# Patient Record
Sex: Male | Born: 1960
Health system: Southern US, Community
[De-identification: ages and names within clinical notes are randomized; demographics above are authoritative.]

## PROBLEM LIST (undated history)

## (undated) DIAGNOSIS — D126 Benign neoplasm of colon, unspecified: Secondary | ICD-10-CM

## (undated) DIAGNOSIS — I10 Essential (primary) hypertension: Secondary | ICD-10-CM

## (undated) DIAGNOSIS — E785 Hyperlipidemia, unspecified: Secondary | ICD-10-CM

## (undated) DIAGNOSIS — C61 Malignant neoplasm of prostate: Secondary | ICD-10-CM

## (undated) DIAGNOSIS — N400 Enlarged prostate without lower urinary tract symptoms: Secondary | ICD-10-CM

## (undated) DIAGNOSIS — G4733 Obstructive sleep apnea (adult) (pediatric): Secondary | ICD-10-CM

## (undated) DIAGNOSIS — K219 Gastro-esophageal reflux disease without esophagitis: Secondary | ICD-10-CM

## (undated) HISTORY — PX: CYST EXCISION: SHX5701

## (undated) HISTORY — PX: VASECTOMY: SHX75

## (undated) HISTORY — PX: NO PAST SURGERIES: SHX2092

---

## 2012-01-21 DIAGNOSIS — D126 Benign neoplasm of colon, unspecified: Secondary | ICD-10-CM | POA: Insufficient documentation

## 2015-05-16 DIAGNOSIS — Z0000 Encounter for general adult medical examination without abnormal findings: Secondary | ICD-10-CM | POA: Diagnosis not present

## 2015-05-16 DIAGNOSIS — I10 Essential (primary) hypertension: Secondary | ICD-10-CM | POA: Diagnosis not present

## 2015-05-16 DIAGNOSIS — E785 Hyperlipidemia, unspecified: Secondary | ICD-10-CM | POA: Diagnosis not present

## 2015-06-05 DIAGNOSIS — I10 Essential (primary) hypertension: Secondary | ICD-10-CM | POA: Diagnosis not present

## 2015-06-05 DIAGNOSIS — N400 Enlarged prostate without lower urinary tract symptoms: Secondary | ICD-10-CM | POA: Diagnosis not present

## 2015-06-05 DIAGNOSIS — E785 Hyperlipidemia, unspecified: Secondary | ICD-10-CM | POA: Diagnosis not present

## 2015-11-21 DIAGNOSIS — N400 Enlarged prostate without lower urinary tract symptoms: Secondary | ICD-10-CM | POA: Diagnosis not present

## 2015-11-21 DIAGNOSIS — N401 Enlarged prostate with lower urinary tract symptoms: Secondary | ICD-10-CM | POA: Diagnosis not present

## 2015-11-21 DIAGNOSIS — I10 Essential (primary) hypertension: Secondary | ICD-10-CM | POA: Diagnosis not present

## 2015-11-21 DIAGNOSIS — R972 Elevated prostate specific antigen [PSA]: Secondary | ICD-10-CM | POA: Diagnosis not present

## 2015-11-21 DIAGNOSIS — E785 Hyperlipidemia, unspecified: Secondary | ICD-10-CM | POA: Diagnosis not present

## 2015-11-27 ENCOUNTER — Encounter: Payer: Self-pay | Admitting: Urology

## 2015-11-27 ENCOUNTER — Ambulatory Visit (INDEPENDENT_AMBULATORY_CARE_PROVIDER_SITE_OTHER): Payer: BLUE CROSS/BLUE SHIELD | Admitting: Urology

## 2015-11-27 VITALS — BP 159/83 | HR 48 | Ht 73.0 in | Wt 231.2 lb

## 2015-11-27 DIAGNOSIS — R0683 Snoring: Secondary | ICD-10-CM | POA: Insufficient documentation

## 2015-11-27 DIAGNOSIS — E785 Hyperlipidemia, unspecified: Secondary | ICD-10-CM | POA: Insufficient documentation

## 2015-11-27 DIAGNOSIS — I10 Essential (primary) hypertension: Secondary | ICD-10-CM | POA: Insufficient documentation

## 2015-11-27 DIAGNOSIS — R351 Nocturia: Secondary | ICD-10-CM

## 2015-11-27 DIAGNOSIS — R972 Elevated prostate specific antigen [PSA]: Secondary | ICD-10-CM | POA: Diagnosis not present

## 2015-11-27 DIAGNOSIS — N400 Enlarged prostate without lower urinary tract symptoms: Secondary | ICD-10-CM | POA: Insufficient documentation

## 2015-11-27 DIAGNOSIS — N401 Enlarged prostate with lower urinary tract symptoms: Secondary | ICD-10-CM | POA: Diagnosis not present

## 2015-11-27 NOTE — Progress Notes (Signed)
11/27/2015 3:19 PM   Garrett Watkins 12/16/1960 [id number]  Referring provider: Kirk Ruths, MD [facility] [place], [place] [address]  Chief Complaint  Patient presents with  . New Patient (Initial Visit)    elevated PSA     HPI: The patient is a 55 year old gentleman who presents today for evaluation of an elevated PSA. His PSA in November 2017 was 4.14. It was last checked in May 2017 when it was 3.52.  It was 1.35 in January 2014. He has nocturia 2 token be as high as 4 times and some nights. He feels that he empties his bladder. His sternal starting his stream. He gets occasional urinary urgency which is bothersome when his posterior meeting. He has no other urological complaints at this time.   PMH: No past medical history on file.  Surgical History: Past Surgical History:  Procedure Laterality Date  . NO PAST SURGERIES      Home Medications:    Medication List       Accurate as of 11/27/15  3:19 PM. Always use your most recent med list.          atorvastatin 20 MG tablet Commonly known as:  LIPITOR Take by mouth.   carvedilol 6.25 MG tablet Commonly known as:  COREG Take by mouth.   losartan-hydrochlorothiazide 100-12.5 MG tablet Commonly known as:  HYZAAR Take by mouth.   MULTI VITAMIN DAILY PO Take by mouth.       Allergies:  Allergies  Allergen Reactions  . Amlodipine Other (See Comments)    edema    Family History: Family History  Problem Relation Age of Onset  . Prostate cancer Neg Hx     Social History:  reports that he has never smoked. He has never used smokeless tobacco. He reports that he does not drink alcohol or use drugs.  ROS: UROLOGY Frequent Urination?: Yes Hard to postpone urination?: No Burning/pain with urination?: No Get up at night to urinate?: Yes Leakage of urine?: No Urine stream starts and stops?: Yes Trouble starting stream?: No Do you have to strain to urinate?:  No Blood in urine?: No Urinary tract infection?: No Sexually transmitted disease?: No Injury to kidneys or bladder?: No Painful intercourse?: No Weak stream?: Yes Erection problems?: No Penile pain?: No  Gastrointestinal Nausea?: No Vomiting?: No Indigestion/heartburn?: No Diarrhea?: No Constipation?: No  Constitutional Fever: No Night sweats?: No Weight loss?: No Fatigue?: No  Skin Skin rash/lesions?: No Itching?: No  Eyes Blurred vision?: No Double vision?: No  Ears/Nose/Throat Sore throat?: No Sinus problems?: No  Hematologic/Lymphatic Swollen glands?: No Easy bruising?: No  Cardiovascular Leg swelling?: No Chest pain?: No  Respiratory Cough?: No Shortness of breath?: No  Endocrine Excessive thirst?: No  Musculoskeletal Back pain?: No Joint pain?: No  Neurological Headaches?: No Dizziness?: No  Psychologic Depression?: No Anxiety?: No  Physical Exam: BP (!) 159/83   Pulse (!) 48   Ht 6\' 1"  (1.854 m)   Wt 231 lb 3.2 oz (104.9 kg)   BMI 30.50 kg/m   Constitutional:  Alert and oriented, No acute distress. HEENT: [place] AT, moist mucus membranes.  Trachea midline, no masses. Cardiovascular: No clubbing, cyanosis, or edema. Respiratory: Normal respiratory effort, no increased work of breathing. GI: Abdomen is soft, nontender, nondistended, no abdominal masses GU: No CVA tenderness. Normal phallus. Testicles descended equal bilaterally no masses. DRE: 2+ smooth benign. Skin: No rashes, bruises or suspicious lesions. Lymph: No cervical or inguinal  adenopathy. Neurologic: Grossly intact, no focal deficits, moving all 4 extremities. Psychiatric: Normal mood and affect.  Laboratory Data: No results found for: WBC, HGB, HCT, MCV, PLT  No results found for: CREATININE  No results found for: PSA  No results found for: TESTOSTERONE  No results found for: HGBA1C  Urinalysis No results found for: COLORURINE, APPEARANCEUR, LABSPEC, PHURINE,  GLUCOSEU, HGBUR, BILIRUBINUR, KETONESUR, PROTEINUR, UROBILINOGEN, NITRITE, LEUKOCYTESUR    Assessment & Plan:    1. Elevated PSA I had a long discussion with the patient regarding the role of PSA testing and prostate cancer screening. We also discussed the natural history of prostate cancer. We discussed that the next for some of elevated PSAs to confirm that this is a true elevation with a repeat PSA test. We also discussed with that we typically schedule prostate biopsy pending the results of this repeat PSA. He understands the risks, benefits, indications for prostate biopsy. He understands the risks including bleeding and infection. He understands any blood in his urine, stool, an semen. In his semen and may persist for up to 6 weeks. He also understands the risk of infection of about 1% would require inpatient hospitalization for IV antibiotics. All questions were answered. The patient has elected to proceed with prostate biopsy pending the results of his repeat PSA.  2. BPH The patient is unsure if he wants to start a medication for his symptomatic BPH. I think you to be a good candidate for Flomax at this time. He will think about this medication if he would like to start in the future.  Return for prostate biopsy.  Nickie Retort, MD  [facility] [address], [place], [place] [address] [phone]

## 2015-11-28 LAB — FPSA% REFLEX
% FREE PSA: 10 %
PSA, FREE: 0.41 ng/mL

## 2015-11-28 LAB — PSA TOTAL (REFLEX TO FREE): Prostate Specific Ag, Serum: 4.1 ng/mL — ABNORMAL HIGH (ref 0.0–4.0)

## 2015-12-03 ENCOUNTER — Telehealth: Payer: Self-pay

## 2015-12-03 NOTE — Telephone Encounter (Signed)
Please let patient knew PSA remains elevated, so we should proceed with prostate biopsy. Thanks   Pt notified, no questions or concerns.

## 2015-12-30 ENCOUNTER — Ambulatory Visit: Payer: BLUE CROSS/BLUE SHIELD | Admitting: Urology

## 2015-12-30 ENCOUNTER — Other Ambulatory Visit: Payer: Self-pay | Admitting: Urology

## 2015-12-30 DIAGNOSIS — R350 Frequency of micturition: Secondary | ICD-10-CM | POA: Diagnosis not present

## 2015-12-30 DIAGNOSIS — C61 Malignant neoplasm of prostate: Secondary | ICD-10-CM | POA: Diagnosis not present

## 2015-12-30 DIAGNOSIS — R972 Elevated prostate specific antigen [PSA]: Secondary | ICD-10-CM | POA: Insufficient documentation

## 2015-12-30 MED ORDER — GENTAMICIN SULFATE 40 MG/ML IJ SOLN
80.0000 mg | Freq: Once | INTRAMUSCULAR | Status: AC
  Administered 2015-12-30: 80 mg via INTRAMUSCULAR

## 2015-12-30 MED ORDER — LEVOFLOXACIN 500 MG PO TABS
500.0000 mg | ORAL_TABLET | Freq: Once | ORAL | Status: AC
  Administered 2015-12-30: 500 mg via ORAL

## 2015-12-30 MED ORDER — LIDOCAINE HCL 2 % EX GEL
1.0000 "application " | Freq: Once | CUTANEOUS | Status: AC
  Administered 2015-12-30: 1

## 2015-12-30 NOTE — Addendum Note (Signed)
Addended by: Wilson Singer on: 12/30/2015 09:16 AM   Modules accepted: Orders

## 2015-12-30 NOTE — Progress Notes (Signed)
Marrio Kwolek 1960-11-12 [id number]  Referring provider: Kirk Ruths, MD [facility] [place], [place] [address]  CC: Prostate Biopsy  HPI:  1 - Elevated / Rising PSA -  2014 - PSA 1.35 05/2015 - PSA 3.52;  11/2015 PSA 4.14 --> TRUS 77mL  2 - Lower Urinary Tract Symptoms / Nocturia - pt with modest bother from urgency / freqeuncy with nocturia x 4. Has elected observation.   Today "Garrett Watkins" is seen to proceed with prostate biopsy. He took ABX and enema as RX'd.     Surgical History: Past Surgical History:  Procedure Laterality Date  . NO PAST SURGERIES      Home Medications:    Medication List       Accurate as of 11/27/15  3:19 PM. Always use your most recent med list.          atorvastatin 20 MG tablet Commonly known as:  LIPITOR Take by mouth.   carvedilol 6.25 MG tablet Commonly known as:  COREG Take by mouth.   losartan-hydrochlorothiazide 100-12.5 MG tablet Commonly known as:  HYZAAR Take by mouth.   MULTI VITAMIN DAILY PO Take by mouth.       Allergies:  Allergies  Allergen Reactions  . Amlodipine Other (See Comments)    edema    Family History: Family History  Problem Relation Age of Onset  . Prostate cancer Neg Hx     Social History:  reports that he has never smoked. He has never used smokeless tobacco. He reports that he does not drink alcohol or use drugs.  ROS: UROLOGY Frequent Urination?: Yes Hard to postpone urination?: No Burning/pain with urination?: No Get up at night to urinate?: Yes Leakage of urine?: No Urine stream starts and stops?: Yes Trouble starting stream?: No Do you have to strain to urinate?: No Blood in urine?: No Urinary tract infection?: No Sexually transmitted disease?: No Injury to kidneys or bladder?: No Painful intercourse?: No Weak stream?: Yes Erection problems?: No Penile pain?: No  Gastrointestinal Nausea?: No Vomiting?: No Indigestion/heartburn?:  No Diarrhea?: No Constipation?: No  Constitutional Fever: No Night sweats?: No Weight loss?: No Fatigue?: No  Skin Skin rash/lesions?: No Itching?: No  Eyes Blurred vision?: No Double vision?: No  Ears/Nose/Throat Sore throat?: No Sinus problems?: No  Hematologic/Lymphatic Swollen glands?: No Easy bruising?: No  Cardiovascular Leg swelling?: No Chest pain?: No  Respiratory Cough?: No Shortness of breath?: No  Endocrine Excessive thirst?: No  Musculoskeletal Back pain?: No Joint pain?: No  Neurological Headaches?: No Dizziness?: No  Psychologic Depression?: No Anxiety?: No  Physical Exam: BP (!) 159/83   Pulse (!) 48   Ht 6\' 1"  (1.854 m)   Wt 231 lb 3.2 oz (104.9 kg)   BMI 30.50 kg/m   Constitutional:  Alert and oriented, No acute distress. HEENT: [place] AT, moist mucus membranes.  Trachea midline, no masses. Cardiovascular: No clubbing, cyanosis, or edema. Respiratory: Normal respiratory effort, no increased work of breathing. GI: Abdomen is soft, nontender, nondistended, no abdominal masses GU: No CVA tenderness. Normal phallus. Testicles descended equal bilaterally no masses. DRE: 2+ smooth benign. Skin: No rashes, bruises or suspicious lesions. Lymph: No cervical or inguinal adenopathy. Neurologic: Grossly intact, no focal deficits, moving all 4 extremities. Psychiatric: Normal mood and affect.  Laboratory Data: No results found for: WBC, HGB, HCT, MCV, PLT  No results found for: CREATININE  No results found for: PSA  No results found for: TESTOSTERONE  No results found for: HGBA1C  Urinalysis No results found for: COLORURINE, APPEARANCEUR, LABSPEC, [place], GLUCOSEU, HGBUR, BILIRUBINUR, KETONESUR, PROTEINUR, UROBILINOGEN, NITRITE, LEUKOCYTESUR   Prostate Biopsy Procedure   Informed consent was obtained after discussing risks/benefits of the procedure.  A time out was performed to ensure correct patient identity.  Pre-Procedure: -  Last PSA Level: No results found for: PSA - Gentamicin given prophylactically - Levaquin 500 mg administered PO -Transrectal Ultrasound performed revealing a 43.6 gm prostate -No significant hypoechoic or median lobe noted  Procedure: - Prostate block performed using 10 cc 1% lidocaine and biopsies taken from sextant areas, a total of 12 under ultrasound guidance.  Post-Procedure: - Patient tolerated the procedure well - He was counseled to seek immediate medical attention if experiences any severe pain, significant bleeding, or fevers - Return in one week to discuss biopsy results   Assessment & Plan:    1 - Elevated / Rising PSA - s/p biopsy today as per above. Warned to contact MD for new fever 101 or more or inabiliity to void.   2 - Lower Urinary Tract Symptoms / Nocturia - consider alpha blocker should bother progress.  3 - RTC as scheduled for results review.   [facility] [address], [place], [place] [address] [phone]

## 2016-01-04 LAB — PATHOLOGY REPORT

## 2016-01-09 ENCOUNTER — Other Ambulatory Visit: Payer: Self-pay | Admitting: Urology

## 2016-01-16 ENCOUNTER — Encounter: Payer: Self-pay | Admitting: Urology

## 2016-01-16 ENCOUNTER — Ambulatory Visit (INDEPENDENT_AMBULATORY_CARE_PROVIDER_SITE_OTHER): Payer: BLUE CROSS/BLUE SHIELD | Admitting: Urology

## 2016-01-16 VITALS — BP 200/124 | HR 67 | Ht 73.0 in | Wt 235.6 lb

## 2016-01-16 DIAGNOSIS — N400 Enlarged prostate without lower urinary tract symptoms: Secondary | ICD-10-CM

## 2016-01-16 DIAGNOSIS — C61 Malignant neoplasm of prostate: Secondary | ICD-10-CM | POA: Diagnosis not present

## 2016-01-16 DIAGNOSIS — I10 Essential (primary) hypertension: Secondary | ICD-10-CM | POA: Diagnosis not present

## 2016-01-16 MED ORDER — TAMSULOSIN HCL 0.4 MG PO CAPS: 0.4000 mg | ORAL_CAPSULE | Freq: Every day | ORAL | 11 refills | Status: DC

## 2016-01-16 NOTE — Progress Notes (Addendum)
01/16/2016 12:29 PM   Garrett Watkins March 14, 1960 [id number]  Referring provider: Kirk Ruths, MD [facility] [place], [place] [address]  Chief Complaint  Patient presents with  . Results    HPI: 1 - Elevated / Rising PSA/Prostate Cancer 2014 - PSA 1.35 05/2015 - PSA 3.52;  11/2015 PSA 4.14 --> TRUS 34mL  Biopsy showed 3 of 12 cores positive. 21% of one core of Gleason 3+4=7. 2 cores (2%, 52%) gleason 3+3=6.  2 - Lower Urinary Tract Symptoms / Nocturia - pt with modest bother from urgency / freqeuncy with nocturia x 4. Has elected observation.      PMH: No past medical history on file.  Surgical History: Past Surgical History:  Procedure Laterality Date  . NO PAST SURGERIES      Home Medications:  Allergies as of 01/16/2016      Reactions   Amlodipine Other (See Comments)   edema      Medication List       Accurate as of 01/16/16 12:29 PM. Always use your most recent med list.          atorvastatin 20 MG tablet Commonly known as:  LIPITOR Take by mouth.   carvedilol 6.25 MG tablet Commonly known as:  COREG Take by mouth.   losartan 100 MG tablet Commonly known as:  COZAAR TAKE 1 TABLET (100 MG TOTAL) BY MOUTH ONCE DAILY.   losartan-hydrochlorothiazide 100-12.5 MG tablet Commonly known as:  HYZAAR Take by mouth.   MULTI VITAMIN DAILY PO Take by mouth.       Allergies:  Allergies  Allergen Reactions  . Amlodipine Other (See Comments)    edema    Family History: Family History  Problem Relation Age of Onset  . Prostate cancer Neg Hx     Social History:  reports that he has never smoked. He has never used smokeless tobacco. He reports that he does not drink alcohol or use drugs.  ROS: UROLOGY Frequent Urination?: Yes Hard to postpone urination?: No Burning/pain with urination?: No Get up at night to urinate?: Yes Leakage of urine?: No Urine stream starts and stops?: No Trouble starting stream?:  No Do you have to strain to urinate?: Yes Blood in urine?: No Urinary tract infection?: No Sexually transmitted disease?: No Injury to kidneys or bladder?: No Painful intercourse?: No Weak stream?: Yes Erection problems?: No Penile pain?: No  Gastrointestinal Nausea?: No Vomiting?: No Indigestion/heartburn?: No Diarrhea?: No Constipation?: No  Constitutional Fever: No Night sweats?: No Weight loss?: No Fatigue?: No  Skin Skin rash/lesions?: No Itching?: No  Eyes Blurred vision?: No Double vision?: No  Ears/Nose/Throat Sore throat?: Yes Sinus problems?: Yes  Hematologic/Lymphatic Swollen glands?: No Easy bruising?: No  Cardiovascular Leg swelling?: No Chest pain?: No  Respiratory Cough?: Yes Shortness of breath?: Yes  Endocrine Excessive thirst?: No  Musculoskeletal Back pain?: No Joint pain?: No  Neurological Headaches?: No Dizziness?: No  Psychologic Depression?: No Anxiety?: No  Physical Exam: BP (!) 200/124   Pulse 67   Ht 6\' 1"  (1.854 m)   Wt 235 lb 9.6 oz (106.9 kg)   BMI 31.08 kg/m   Constitutional:  Alert and oriented, No acute distress. HEENT: [place] AT, moist mucus membranes.  Trachea midline, no masses. Cardiovascular: No clubbing, cyanosis, or edema. Respiratory: Normal respiratory effort, no increased work of breathing. GI: Abdomen is soft, nontender, nondistended, no abdominal masses GU: No CVA tenderness.  Skin: No rashes, bruises or suspicious lesions.  Lymph: No cervical or inguinal adenopathy. Neurologic: Grossly intact, no focal deficits, moving all 4 extremities. Psychiatric: Normal mood and affect.  Laboratory Data: No results found for: WBC, HGB, HCT, MCV, PLT  No results found for: CREATININE  No results found for: PSA  No results found for: TESTOSTERONE  No results found for: HGBA1C  Urinalysis No results found for: COLORURINE, APPEARANCEUR, LABSPEC, PHURINE, GLUCOSEU, HGBUR, BILIRUBINUR, KETONESUR,  PROTEINUR, UROBILINOGEN, NITRITE, LEUKOCYTESUR   Assessment & Plan:    1. Prostate Cancer I had a long discussion with the patient regarding his new diagnosis of intermediate risk prostate cancer. We discussed the natural history of prostate cancer as well as the possible treatment alternatives. We discussed all treatment options that are available for prostate cancer which include watchful waiting, active surveillance, robotic prostatectomy, radiation therapy, and cryotherapy. I did discuss with him due to his intermediate risk disease that he would be best served by either radiation therapy or robotic prostatectomy. We discussed the risks and benefits of both treatment of options. He does have great erections per him and his wife at this time and is able to complete intercourse. If he went with the prostatectomy, I do feel he would benefit from nursing care. I think she will be best served by seeing my partner who is more proficient at the nerve sparing procedure. At this time, he would like to discuss this further with his family and see radiation oncology in consultation. We will have him see radiation oncology and follow-up with [place] in approximately one month to further discuss his treatment decisions. He was also given the 100 questions of prostate cancer booklet.  2. BPH Patient interested in trying Flomax for his urinary symptoms especially nocturia. We will start him on Flomax or 0.4 mg daily. He is warm the risk of orthostatic hypotension and retrograde ejaculation.  Return in about 4 weeks (around 02/13/2016).  Nickie Retort, MD  [facility] [address], [place], [place] [address] [phone]

## 2016-01-16 NOTE — Addendum Note (Signed)
Addended by: Kerry Hough on: 01/16/2016 12:42 PM   Modules accepted: Orders

## 2016-01-24 ENCOUNTER — Encounter: Payer: Self-pay | Admitting: Emergency Medicine

## 2016-01-24 ENCOUNTER — Emergency Department: Payer: BLUE CROSS/BLUE SHIELD

## 2016-01-24 ENCOUNTER — Observation Stay
Admission: EM | Admit: 2016-01-24 | Discharge: 2016-01-25 | Disposition: A | Discharge status: Home / Self Care | Payer: BLUE CROSS/BLUE SHIELD | Attending: Internal Medicine | Admitting: Internal Medicine

## 2016-01-24 ENCOUNTER — Encounter
Admission: EM | Disposition: A | Discharge status: Home / Self Care | Payer: Self-pay | Source: Home / Self Care | Attending: Emergency Medicine

## 2016-01-24 DIAGNOSIS — N400 Enlarged prostate without lower urinary tract symptoms: Secondary | ICD-10-CM | POA: Diagnosis not present

## 2016-01-24 DIAGNOSIS — I200 Unstable angina: Secondary | ICD-10-CM | POA: Diagnosis not present

## 2016-01-24 DIAGNOSIS — R0789 Other chest pain: Secondary | ICD-10-CM | POA: Diagnosis not present

## 2016-01-24 DIAGNOSIS — I10 Essential (primary) hypertension: Secondary | ICD-10-CM | POA: Insufficient documentation

## 2016-01-24 DIAGNOSIS — Z79899 Other long term (current) drug therapy: Secondary | ICD-10-CM | POA: Diagnosis not present

## 2016-01-24 DIAGNOSIS — I214 Non-ST elevation (NSTEMI) myocardial infarction: Principal | ICD-10-CM | POA: Insufficient documentation

## 2016-01-24 DIAGNOSIS — E782 Mixed hyperlipidemia: Secondary | ICD-10-CM | POA: Diagnosis not present

## 2016-01-24 DIAGNOSIS — C61 Malignant neoplasm of prostate: Secondary | ICD-10-CM | POA: Insufficient documentation

## 2016-01-24 DIAGNOSIS — I160 Hypertensive urgency: Secondary | ICD-10-CM | POA: Diagnosis not present

## 2016-01-24 DIAGNOSIS — R079 Chest pain, unspecified: Secondary | ICD-10-CM | POA: Diagnosis present

## 2016-01-24 DIAGNOSIS — I499 Cardiac arrhythmia, unspecified: Secondary | ICD-10-CM | POA: Diagnosis present

## 2016-01-24 HISTORY — DX: Obstructive sleep apnea (adult) (pediatric): G47.33

## 2016-01-24 HISTORY — DX: Malignant neoplasm of prostate: C61

## 2016-01-24 HISTORY — DX: Essential (primary) hypertension: I10

## 2016-01-24 HISTORY — PX: CARDIAC CATHETERIZATION: SHX172

## 2016-01-24 HISTORY — DX: Benign neoplasm of colon, unspecified: D12.6

## 2016-01-24 HISTORY — DX: Benign prostatic hyperplasia without lower urinary tract symptoms: N40.0

## 2016-01-24 HISTORY — DX: Hyperlipidemia, unspecified: E78.5

## 2016-01-24 LAB — BASIC METABOLIC PANEL
Anion gap: 7 (ref 5–15)
BUN: 14 mg/dL (ref 6–20)
CALCIUM: 9.3 mg/dL (ref 8.9–10.3)
CO2: 28 mmol/L (ref 22–32)
Chloride: 102 mmol/L (ref 101–111)
Creatinine, Ser: 1.04 mg/dL (ref 0.61–1.24)
GFR calc Af Amer: >60 mL/min (ref >60)
GLUCOSE: 94 mg/dL (ref 65–99)
Potassium: 4.7 mmol/L (ref 3.5–5.1)
SODIUM: 137 mmol/L (ref 135–145)

## 2016-01-24 LAB — TSH: TSH: 1.502 u[IU]/mL (ref 0.350–4.500)

## 2016-01-24 LAB — CBC
HEMATOCRIT: 39.9 % — AB (ref 40.0–52.0)
Hemoglobin: 13.6 g/dL (ref 13.0–18.0)
MCH: 29.9 pg (ref 26.0–34.0)
MCHC: 34 g/dL (ref 32.0–36.0)
MCV: 87.9 fL (ref 80.0–100.0)
PLATELETS: 176 10*3/uL (ref 150–440)
RBC: 4.54 MIL/uL (ref 4.40–5.90)
RDW: 13.7 % (ref 11.5–14.5)
WBC: 5.4 10*3/uL (ref 3.8–10.6)

## 2016-01-24 LAB — TROPONIN I

## 2016-01-24 SURGERY — LEFT HEART CATH AND CORONARY ANGIOGRAPHY
Anesthesia: Moderate Sedation | Laterality: Right

## 2016-01-24 MED ORDER — ONDANSETRON HCL 4 MG/2ML IJ SOLN: 4.0000 mg | Freq: Four times a day (QID) | INTRAMUSCULAR | Status: DC | PRN

## 2016-01-24 MED ORDER — MIDAZOLAM HCL 2 MG/2ML IJ SOLN
INTRAMUSCULAR | Status: AC
  Filled 2016-01-24: qty 2

## 2016-01-24 MED ORDER — ASPIRIN 81 MG PO CHEW: 81.0000 mg | CHEWABLE_TABLET | ORAL | Status: DC

## 2016-01-24 MED ORDER — HEPARIN (PORCINE) IN NACL 2-0.9 UNIT/ML-% IJ SOLN
INTRAMUSCULAR | Status: AC
  Filled 2016-01-24: qty 500

## 2016-01-24 MED ORDER — FENTANYL CITRATE (PF) 100 MCG/2ML IJ SOLN
INTRAMUSCULAR | Status: AC
  Filled 2016-01-24: qty 2

## 2016-01-24 MED ORDER — ATENOLOL 25 MG PO TABS: 50.0000 mg | ORAL_TABLET | Freq: Every day | ORAL | Status: DC

## 2016-01-24 MED ORDER — SODIUM CHLORIDE 0.9% FLUSH
3.0000 mL | Freq: Two times a day (BID) | INTRAVENOUS | Status: DC
  Administered 2016-01-24: 3 mL via INTRAVENOUS

## 2016-01-24 MED ORDER — ONDANSETRON HCL 4 MG PO TABS: 4.0000 mg | ORAL_TABLET | Freq: Four times a day (QID) | ORAL | Status: DC | PRN

## 2016-01-24 MED ORDER — HYDROCHLOROTHIAZIDE 25 MG PO TABS
25.0000 mg | ORAL_TABLET | Freq: Every day | ORAL | Status: DC
  Filled 2016-01-24: qty 1

## 2016-01-24 MED ORDER — CARVEDILOL 25 MG PO TABS
25.0000 mg | ORAL_TABLET | Freq: Two times a day (BID) | ORAL | Status: DC
  Filled 2016-01-24 (×2): qty 1

## 2016-01-24 MED ORDER — ENOXAPARIN SODIUM 40 MG/0.4ML ~~LOC~~ SOLN
40.0000 mg | SUBCUTANEOUS | Status: DC
  Filled 2016-01-24: qty 0.4

## 2016-01-24 MED ORDER — SODIUM CHLORIDE 0.9 % IV SOLN: 250.0000 mL | INTRAVENOUS | Status: DC | PRN

## 2016-01-24 MED ORDER — FENTANYL CITRATE (PF) 100 MCG/2ML IJ SOLN
INTRAMUSCULAR | Status: DC | PRN
  Administered 2016-01-24: 25 ug via INTRAVENOUS

## 2016-01-24 MED ORDER — CLONIDINE HCL 0.1 MG PO TABS
0.2000 mg | ORAL_TABLET | Freq: Two times a day (BID) | ORAL | Status: DC
  Administered 2016-01-24 – 2016-01-25 (×3): 0.2 mg via ORAL
  Filled 2016-01-24 (×3): qty 2

## 2016-01-24 MED ORDER — ATORVASTATIN CALCIUM 20 MG PO TABS
40.0000 mg | ORAL_TABLET | Freq: Every day | ORAL | Status: DC
  Administered 2016-01-24: 40 mg via ORAL
  Filled 2016-01-24: qty 2

## 2016-01-24 MED ORDER — SODIUM CHLORIDE 0.9 % WEIGHT BASED INFUSION
1.0000 mL/kg/h | INTRAVENOUS | Status: DC
Start: 2016-01-24 — End: 2016-01-25
  Administered 2016-01-24: 1 mL/kg/h via INTRAVENOUS

## 2016-01-24 MED ORDER — LOSARTAN POTASSIUM-HCTZ 100-25 MG PO TABS: 1.0000 | ORAL_TABLET | Freq: Every day | ORAL | Status: DC

## 2016-01-24 MED ORDER — SODIUM CHLORIDE 0.9% FLUSH: 3.0000 mL | INTRAVENOUS | Status: DC | PRN

## 2016-01-24 MED ORDER — HEPARIN (PORCINE) IN NACL 2-0.9 UNIT/ML-% IJ SOLN
INTRAMUSCULAR | Status: DC | PRN
  Administered 2016-01-24: 500 mL via INTRA_ARTERIAL

## 2016-01-24 MED ORDER — LOSARTAN POTASSIUM 50 MG PO TABS
100.0000 mg | ORAL_TABLET | Freq: Every day | ORAL | Status: DC
  Administered 2016-01-25: 100 mg via ORAL
  Filled 2016-01-24 (×2): qty 2

## 2016-01-24 MED ORDER — ACETAMINOPHEN 325 MG PO TABS: 650.0000 mg | ORAL_TABLET | ORAL | Status: DC | PRN

## 2016-01-24 MED ORDER — ASPIRIN EC 325 MG PO TBEC
325.0000 mg | DELAYED_RELEASE_TABLET | Freq: Every day | ORAL | Status: DC
  Administered 2016-01-24 – 2016-01-25 (×2): 325 mg via ORAL
  Filled 2016-01-24 (×2): qty 1

## 2016-01-24 MED ORDER — SODIUM CHLORIDE 0.9 % WEIGHT BASED INFUSION
3.0000 mL/kg/h | INTRAVENOUS | Status: DC
  Administered 2016-01-24: 3 mL/kg/h via INTRAVENOUS

## 2016-01-24 MED ORDER — MIDAZOLAM HCL 2 MG/2ML IJ SOLN
INTRAMUSCULAR | Status: DC | PRN
Start: 2016-01-24 — End: 2016-01-24
  Administered 2016-01-24: 1 mg via INTRAVENOUS

## 2016-01-24 MED ORDER — SODIUM CHLORIDE 0.9 % WEIGHT BASED INFUSION: 1.0000 mL/kg/h | INTRAVENOUS | Status: DC

## 2016-01-24 MED ORDER — HYDRALAZINE HCL 20 MG/ML IJ SOLN
10.0000 mg | Freq: Four times a day (QID) | INTRAMUSCULAR | Status: DC | PRN
  Administered 2016-01-24: 10 mg via INTRAVENOUS
  Filled 2016-01-24: qty 1

## 2016-01-24 MED ORDER — CARVEDILOL 6.25 MG PO TABS: 6.2500 mg | ORAL_TABLET | Freq: Two times a day (BID) | ORAL | Status: DC

## 2016-01-24 MED ORDER — ACETAMINOPHEN 325 MG PO TABS: 650.0000 mg | ORAL_TABLET | Freq: Four times a day (QID) | ORAL | Status: DC | PRN

## 2016-01-24 MED ORDER — SODIUM CHLORIDE 0.9% FLUSH: 3.0000 mL | Freq: Two times a day (BID) | INTRAVENOUS | Status: DC

## 2016-01-24 MED ORDER — HYDROCODONE-ACETAMINOPHEN 5-325 MG PO TABS: 1.0000 | ORAL_TABLET | ORAL | Status: DC | PRN

## 2016-01-24 MED ORDER — TAMSULOSIN HCL 0.4 MG PO CAPS
0.4000 mg | ORAL_CAPSULE | Freq: Every day | ORAL | Status: DC
  Administered 2016-01-24: 0.4 mg via ORAL
  Filled 2016-01-24 (×2): qty 1

## 2016-01-24 MED ORDER — IOPAMIDOL (ISOVUE-300) INJECTION 61%
INTRAVENOUS | Status: DC | PRN
  Administered 2016-01-24: 110 mL via INTRA_ARTERIAL

## 2016-01-24 MED ORDER — ACETAMINOPHEN 650 MG RE SUPP: 650.0000 mg | Freq: Four times a day (QID) | RECTAL | Status: DC | PRN

## 2016-01-24 SURGICAL SUPPLY — 9 items
CATH 5FR JL4 DIAGNOSTIC (CATHETERS) ×3 IMPLANT
CATH 5FR JR4 DIAGNOSTIC (CATHETERS) ×3 IMPLANT
CATH 5FR PIGTAIL DIAGNOSTIC (CATHETERS) ×3 IMPLANT
DEVICE CLOSURE MYNXGRIP 5F (Vascular Products) ×3 IMPLANT
KIT MANI 3VAL PERCEP (MISCELLANEOUS) ×3 IMPLANT
NEEDLE PERC 18GX7CM (NEEDLE) ×3 IMPLANT
PACK CARDIAC CATH (CUSTOM PROCEDURE TRAY) ×3 IMPLANT
SHEATH PINNACLE 5F 10CM (SHEATH) ×3 IMPLANT
WIRE EMERALD 3MM-J .035X150CM (WIRE) ×3 IMPLANT

## 2016-01-24 NOTE — H&P (Signed)
[place] [facility] [place] NAME: Garrett Watkins    MR#:  [id number]  DATE OF BIRTH:  [birth year]  DATE OF ADMISSION:  01/24/2016  PRIMARY CARE PHYSICIAN: Kirk Ruths., MD   REQUESTING/REFERRING PHYSICIAN:  Conni Slipper MD  CHIEF COMPLAINT:   Chief Complaint  Patient presents with  . Irregular Heart Beat    HISTORY OF PRESENT ILLNESS: Garrett Watkins  is a 56 y.o. male with a known history of BPH, colon adenoma, Dyslipidemia, essential hypertension, obstructive sleep apnea and prostate cancer presents to the emergency room complaining of chest pressure. With exertion and rest. He reports that this is been going on for the past few days. He has had issues with his blood pressure being poorly controlled over the past few months. He states that his been tried on various medications without much help. He was seen by Dr. Ouida Sills recently and was started on a new medication however blood pressure continued to be elevated. He also has been feeling of left arm heaviness recently. He denies any radiation of the pain to his jaw. PAST MEDICAL HISTORY:   Past Medical History:  Diagnosis Date  . BPH (benign prostatic hyperplasia)   . Colon adenoma   . Dyslipidemia   . Hypertension   . OSA (obstructive sleep apnea)   . Prostate cancer ([place])     PAST SURGICAL HISTORY: Past Surgical History:  Procedure Laterality Date  . CYST EXCISION    . NO PAST SURGERIES    . VASECTOMY      SOCIAL HISTORY:  Social History  Substance Use Topics  . Smoking status: Never Smoker  . Smokeless tobacco: Never Used  . Alcohol use No    FAMILY HISTORY:  Family History  Problem Relation Age of Onset  . CAD Mother   . Prostate cancer Neg Hx     DRUG ALLERGIES:  Allergies  Allergen Reactions  . Amlodipine Other (See Comments)    edema    REVIEW OF SYSTEMS:   CONSTITUTIONAL: No fever, fatigue or weakness.  EYES: No blurred or double vision.  EARS, NOSE, AND  THROAT: No tinnitus or ear pain.  RESPIRATORY: No cough,  shortness of breath, wheezing or hemoptysis.  CARDIOVASCULAR: Positive chest pressure, orthopnea, edema.  GASTROINTESTINAL: No nausea, vomiting, diarrhea or abdominal pain.  GENITOURINARY: No dysuria, hematuria.  ENDOCRINE: No polyuria, nocturia,  HEMATOLOGY: No anemia, easy bruising or bleeding SKIN: No rash or lesion. MUSCULOSKELETAL: No joint pain or arthritis.   NEUROLOGIC: No tingling, numbness, weakness.  PSYCHIATRY: No anxiety or depression.   MEDICATIONS AT HOME:  Prior to Admission medications   Medication Sig Start Date End Date Taking? Authorizing Provider  atorvastatin (LIPITOR) 20 MG tablet Take by mouth. 05/16/15  Yes Historical Provider, MD  carvedilol (COREG) 6.25 MG tablet Take by mouth 2 (two) times daily with a meal.  11/21/15 11/20/16 Yes Historical Provider, MD  hydrALAZINE (APRESOLINE) 25 MG tablet Take 1 tablet by mouth 2 (two) times daily. 01/16/16  Yes Historical Provider, MD  losartan-hydrochlorothiazide (HYZAAR) 100-25 MG tablet Take 1 tablet by mouth daily. 01/16/16  Yes Historical Provider, MD  Multiple Vitamin (MULTI VITAMIN DAILY PO) Take by mouth. 03/01/08  Yes Historical Provider, MD  tamsulosin (FLOMAX) 0.4 MG CAPS capsule Take 1 capsule (0.4 mg total) by mouth daily. 01/16/16  Yes Nickie Retort, MD      PHYSICAL EXAMINATION:   VITAL SIGNS: Blood pressure (!) 190/115, pulse 74, temperature 98.2 F (36.8 C),  temperature source Oral, resp. rate 16, height 6\' 1"  (1.854 m), weight 225 lb (102.1 kg), SpO2 99 %.  GENERAL:  56 y.o.-year-old patient lying in the bed with no acute distress.  EYES: Pupils equal, round, reactive to light and accommodation. No scleral icterus. Extraocular muscles intact.  HEENT: Head atraumatic, normocephalic. Oropharynx and nasopharynx clear.  NECK:  Supple, no jugular venous distention. No thyroid enlargement, no tenderness.  LUNGS: Normal breath sounds bilaterally, no  wheezing, rales,rhonchi or crepitation. No use of accessory muscles of respiration.  CARDIOVASCULAR: S1, S2 normal. No murmurs, rubs, or gallops.  ABDOMEN: Soft, nontender, nondistended. Bowel sounds present. No organomegaly or mass.  EXTREMITIES: No pedal edema, cyanosis, or clubbing.  NEUROLOGIC: Cranial nerves II through XII are intact. Muscle strength 5/5 in all extremities. Sensation intact. Gait not checked.  PSYCHIATRIC: The patient is alert and oriented x 3.  SKIN: No obvious rash, lesion, or ulcer.   LABORATORY PANEL:   CBC  Recent Labs Lab 01/24/16 1215  WBC 5.4  HGB 13.6  HCT 39.9*  PLT 176  MCV 87.9  MCH 29.9  MCHC 34.0  RDW 13.7   ------------------------------------------------------------------------------------------------------------------  Chemistries   Recent Labs Lab 01/24/16 1215  NA 137  K 4.7  CL 102  CO2 28  GLUCOSE 94  BUN 14  CREATININE 1.04  CALCIUM 9.3   ------------------------------------------------------------------------------------------------------------------ estimated creatinine clearance is 100.8 mL/min (by C-G formula based on SCr of 1.04 mg/dL). ------------------------------------------------------------------------------------------------------------------ No results for input(s): TSH, T4TOTAL, T3FREE, THYROIDAB in the last 72 hours.  Invalid input(s): FREET3   Coagulation profile No results for input(s): INR, PROTIME in the last 168 hours. ------------------------------------------------------------------------------------------------------------------- No results for input(s): DDIMER in the last 72 hours. -------------------------------------------------------------------------------------------------------------------  Cardiac Enzymes  Recent Labs Lab 01/24/16 1215  TROPONINI <0.03   ------------------------------------------------------------------------------------------------------------------ Invalid  input(s): POCBNP  ---------------------------------------------------------------------------------------------------------------  Urinalysis No results found for: COLORURINE, APPEARANCEUR, LABSPEC, PHURINE, GLUCOSEU, HGBUR, BILIRUBINUR, KETONESUR, PROTEINUR, UROBILINOGEN, NITRITE, LEUKOCYTESUR   RADIOLOGY: Dg Chest Portable 1 View  Result Date: 01/24/2016 CLINICAL DATA:  Left-sided chest pressure, left arm pain. EXAM: PORTABLE CHEST 1 VIEW COMPARISON:  None. FINDINGS: Heart and mediastinal contours are within normal limits. No focal opacities or effusions. No acute bony abnormality. IMPRESSION: No active disease. Electronically Signed   By: Rolm Baptise M.D.   On: 01/24/2016 12:45    EKG: Orders placed or performed during the hospital encounter of 01/24/16  . ED EKG within 10 minutes  . ED EKG within 10 minutes  . EKG 12-Lead  . EKG 12-Lead    IMPRESSION AND PLAN: Patient is a 56 year old African-American male with poorly controlled blood pressure presents with chest pressure  1. Unstable angina I have discussed the case with cardiology Dr. Louann Liv he will be performing a cardiac catheterization later this afternoon Start patient asprin  2. Accelerated hypertension I will continue his home regimen with losartan hydrochlorothiazide I will add clonidine to his current regimen We will increase his Coreg dose Use when necessary IV hydralazine  3. Hyperlipidemia unspecified continue cholesterol-lowering medications  4. BPH with prostate cancer continue Flomax  5. Misc: lovenox for dvt proph   All the records are reviewed and case discussed with ED provider. Management plans discussed with the patient, family and they are in agreement.  CODE STATUS: Code Status History    This patient does not have a recorded code status. Please follow your organizational policy for patients in this situation.       TOTAL TIME TAKING CARE OF THIS  PATIENT:19minutes.    Dustin Flock  M.D on 01/24/2016 at 2:05 PM  Between 7am to 6pm - Pager - [phone]  After 6pm go to [email or web address] - password [email or web address] [facility]  Office  [phone]  CC: Primary care physician; Kirk Ruths., MD

## 2016-01-24 NOTE — ED Notes (Signed)
Dr. Kowalski at bedside 

## 2016-01-24 NOTE — ED Notes (Signed)
Consent signed and on chart.

## 2016-01-24 NOTE — Progress Notes (Signed)
Patient seems very stressed and anxious about the cath and his blood pressure.  He doesn't want to take the Losartan, Hydrochlorothiazide, or coreg.  He says they haven't worked to bring his blood pressure down so doesn't see the purpose in continuing to take them.  Reassured him that there are many approaches to lowering BP and other things can be tried.

## 2016-01-24 NOTE — Progress Notes (Signed)
Pt to Specials. States chest heaviness - like when you work out. Constant. Pt NSR. Pt readied for Cardiac Cath procedure. All questions answered states understands. Wife at bedside. Pt up to BR to void. tol well. Back to bed.

## 2016-01-24 NOTE — ED Triage Notes (Signed)
Went to MD office for palpitations and "just not feeling right", sent for EKG changes.  Skin w/d with good color.

## 2016-01-24 NOTE — Progress Notes (Signed)
Er.Kowalski in to speak to pt and wife. Follow up plan of care discussed. meds discussed. All questions answered states understands.

## 2016-01-24 NOTE — ED Notes (Signed)
Admitting MD at bedside.

## 2016-01-24 NOTE — Progress Notes (Signed)
Pt states that he changed his mind. His chest pain is really a 1/10 from arrival from cath lab. No other changes noted.

## 2016-01-24 NOTE — Consult Note (Signed)
[facility] Consultation Note  Patient ID: Garrett Watkins, MRN: [id number], DOB/AGE: [birth year] 56 y.o. MRN: [id number], DOB/AGE: [birth year] 56 y.o. Admit date: 01/24/2016   Date of Consult: 01/24/2016 Primary Physician: Kirk Ruths., MD Primary Cardiologist:None  Chief Complaint:  Chief Complaint  Patient presents with  . Irregular Heart Beat   Reason for Consult: chest pain  HPI: 56 y.o. male with known essential hypertension mixed hyperlipidemia and obstructive sleep apnea for which the patient has been on appropriate medication management over the last many years. The patient has had recent increase in chest pressure and pain over the last month increasing in frequency under intensity at rest at some times but mainly with physical activity. Acutely worse when walking up steps with chest pressure making him stop halfway. The patient has been continued on appropriate antihypertensive medication management although has not had good blood pressure control recently. Sleep apnea has been treated with CPAP machine although is not reliably using his CPAP machine. He has had borderline hyperlipidemia with some simvastatin treatment in the past but currently not using simvastatin. EKG shows no evidence of myocardial infarction and troponins are negative  Past Medical History:  Diagnosis Date  . BPH (benign prostatic hyperplasia)   . Colon adenoma   . Dyslipidemia   . Hypertension   . OSA (obstructive sleep apnea)   . Prostate cancer [facility])       Surgical History:  Past Surgical History:  Procedure Laterality Date  . CYST EXCISION    . NO PAST SURGERIES    . VASECTOMY       Home Meds: Prior to Admission medications   Medication Sig Start Date End Date Taking? Authorizing Provider  atorvastatin (LIPITOR) 20 MG tablet Take by mouth. 05/16/15  Yes Historical Provider, MD  carvedilol (COREG) 6.25 MG tablet Take by mouth 2 (two) times daily with a meal.  11/21/15 11/20/16 Yes Historical Provider, MD  hydrALAZINE  (APRESOLINE) 25 MG tablet Take 1 tablet by mouth 2 (two) times daily. 01/16/16  Yes Historical Provider, MD  losartan-hydrochlorothiazide (HYZAAR) 100-25 MG tablet Take 1 tablet by mouth daily. 01/16/16  Yes Historical Provider, MD  Multiple Vitamin (MULTI VITAMIN DAILY PO) Take by mouth. 03/01/08  Yes Historical Provider, MD  tamsulosin (FLOMAX) 0.4 MG CAPS capsule Take 1 capsule (0.4 mg total) by mouth daily. 01/16/16  Yes Nickie Retort, MD    Inpatient Medications:  . [START ON 01/25/2016] aspirin  81 mg Oral Pre-Cath  . [MAR Hold] aspirin EC  325 mg Oral Daily  . [MAR Hold] carvedilol  25 mg Oral BID WC  . [MAR Hold] cloNIDine  0.2 mg Oral BID  . sodium chloride flush  3 mL Intravenous Q12H   . [START ON 01/25/2016] sodium chloride 3 mL/kg/hr (01/24/16 1535)   Followed by  . [START ON 01/25/2016] sodium chloride      Allergies:  Allergies  Allergen Reactions  . Amlodipine Other (See Comments)    edema    Social History   Social History  . Marital status: Married    Spouse name: N/A  . Number of children: N/A  . Years of education: N/A   Occupational History  . Not on file.   Social History Main Topics  . Smoking status: Never Smoker  . Smokeless tobacco: Never Used  . Alcohol use No  . Drug use: No  . Sexual activity: Not on file   Other Topics Concern  . Not on file   Social History Narrative  . No narrative on  file     Family History  Problem Relation Age of Onset  . CAD Mother   . Prostate cancer Neg Hx      Review of Systems Positive forChest pain pressure Negative for: General:  chills, fever, night sweats or weight changes.  Cardiovascular: PND orthopnea syncope dizziness  Dermatological skin lesions rashes Respiratory: Cough congestion Urologic: Frequent urination urination at night and hematuria Abdominal: negative for nausea, vomiting, diarrhea, bright red blood per rectum, melena, or hematemesis Neurologic: negative for visual changes, and/or  hearing changes  All other systems reviewed and are otherwise negative except as noted above.  Labs:  Recent Labs  01/24/16 1215  TROPONINI <0.03   Lab Results  Component Value Date   WBC 5.4 01/24/2016   HGB 13.6 01/24/2016   HCT 39.9 (L) 01/24/2016   MCV 87.9 01/24/2016   PLT 176 01/24/2016    Recent Labs Lab 01/24/16 1215  NA 137  K 4.7  CL 102  CO2 28  BUN 14  CREATININE 1.04  CALCIUM 9.3  GLUCOSE 94   No results found for: CHOL, HDL, LDLCALC, TRIG No results found for: DDIMER  Radiology/Studies:  Dg Chest Portable 1 View  Result Date: 01/24/2016 CLINICAL DATA:  Left-sided chest pressure, left arm pain. EXAM: PORTABLE CHEST 1 VIEW COMPARISON:  None. FINDINGS: Heart and mediastinal contours are within normal limits. No focal opacities or effusions. No acute bony abnormality. IMPRESSION: No active disease. Electronically Signed   By: Rolm Baptise M.D.   On: 01/24/2016 12:45    [id number] sinus rhythm  Weights: Filed Weights   01/24/16 1210 01/24/16 1518  Weight: 102.1 kg (225 lb) 104.3 kg (230 lb)     Physical Exam: Blood pressure (!) 189/96, pulse 80, temperature 98.1 F (36.7 C), temperature source Oral, resp. rate 16, height 6\' 1"  (1.854 m), weight 104.3 kg (230 lb), SpO2 98 %. Body mass index is 30.34 kg/m. General: Well developed, well nourished, in no acute distress. Head eyes ears nose throat: Normocephalic, atraumatic, sclera non-icteric, no xanthomas, nares are without discharge. No apparent thyromegaly and/or mass  Lungs: Normal respiratory effort.  no wheezes, no rales, no rhonchi.  Heart: RRR with normal S1 S2. no murmur gallop, no rub, PMI is normal size and placement, carotid upstroke normal without bruit, jugular venous pressure is normal Abdomen: Soft, non-tender, non-distended with normoactive bowel sounds. No hepatomegaly. No rebound/guarding. No obvious abdominal masses. Abdominal aorta is normal size without bruit Extremities: No edema.  no cyanosis, no clubbing, no ulcers  Peripheral : 2+ bilateral upper extremity pulses, 2+ bilateral femoral pulses, 2+ bilateral dorsal pedal pulse Neuro: Alert and oriented. No facial asymmetry. No focal deficit. Moves all extremities spontaneously. Musculoskeletal: Normal muscle tone without kyphosis Psych:  Responds to questions appropriately with a normal affect.    Assessment: 56 year old male with essential hypertension makes hyperlipidemia and sleep apnea with progressive unstable angina without evidence of myocardial infarction  Plan: 1. Nitrates and aspirin for further risk reduction possible myocardial infarction 2. Hypertension control with beta blocker and clonidine 3. Proceed to cardiac catheterization to assess coronary anatomy and further treatment thereof is necessary. Patient understands risk and benefits of cardiac catheterization. This includes possibility of death stroke heart attack infection bleeding or blood clot. He is at low risk for conscious sedation  Signed, Corey Skains M.D. [facility] 01/24/2016, 3:53 PM

## 2016-01-24 NOTE — ED Provider Notes (Signed)
[facility] Emergency Department Provider Note   ____________________________________________   First MD Initiated Contact with Patient 01/24/16 1205     (approximate)  I have reviewed the triage vital signs and the nursing notes.   HISTORY  Chief Complaint Irregular Heart Beat    HPI Garrett Watkins is a 56 y.o. male who reports he is having a little trouble sleeping last night and felt his heart was beating faster than usual get up this morning his left arm felt heavy in his chest felt somewhat heavy as well. He reports he's been feeling lightheaded at times and having difficulty concentrating while driving and had also reports that for the last month or 2 when he exercises on the treadmill he gets more heaviness in his chest and his arm. Sometimes he gets a something like this but not quite as severe when he walks up steps. If he just walks along slowly he feels quite well. Rest in time he is feeling this heaviness at rest. He has not had heaviness at rest except for the last couple days ever before as I understand it. Heaviness right now which is very mild.   Past Medical History:  Diagnosis Date  . Hypertension     Patient Active Problem List   Diagnosis Date Noted  . Elevated PSA 12/30/2015  . Urinary frequency 12/30/2015  . BPH (benign prostatic hyperplasia) 11/27/2015  . Dyslipidemia 11/27/2015  . Essential hypertension 11/27/2015  . Hyperlipidemia 11/27/2015  . Hypertension 11/27/2015  . Snoring 11/27/2015  . Colon adenoma 01/21/2012    Past Surgical History:  Procedure Laterality Date  . NO PAST SURGERIES      Prior to Admission medications   Medication Sig Start Date End Date Taking? Authorizing Provider  atorvastatin (LIPITOR) 20 MG tablet Take by mouth. 05/16/15   Historical Provider, MD  carvedilol (COREG) 6.25 MG tablet Take by mouth. 11/21/15 11/20/16  Historical Provider, MD  losartan (COZAAR) 100 MG tablet TAKE 1 TABLET (100 MG  TOTAL) BY MOUTH ONCE DAILY. 12/09/15   Historical Provider, MD  losartan-hydrochlorothiazide Konrad Penta) 100-12.5 MG tablet Take by mouth. 06/05/15 06/04/16  Historical Provider, MD  Multiple Vitamin (MULTI VITAMIN DAILY PO) Take by mouth. 03/01/08   Historical Provider, MD  tamsulosin (FLOMAX) 0.4 MG CAPS capsule Take 1 capsule (0.4 mg total) by mouth daily. 01/16/16   Nickie Retort, MD    Allergies Amlodipine  Family History  Problem Relation Age of Onset  . Prostate cancer Neg Hx     Social History Social History  Substance Use Topics  . Smoking status: Never Smoker  . Smokeless tobacco: Never Used  . Alcohol use No    Review of Systems Constitutional: No fever/chills Eyes: No visual changes. ENT: No sore throat. Cardiovascular:See history of present illness Respiratory: Denies shortness of breath. Gastrointestinal: No abdominal pain.  No nausea, no vomiting.  No diarrhea.  No constipation. Genitourinary: Negative for dysuria. Musculoskeletal: Negative for back pain. Skin: Negative for rash. Neurological: Negative for headaches, focal weakness or numbness.  10-point ROS otherwise negative.  ____________________________________________   PHYSICAL EXAM:  VITAL SIGNS: ED Triage Vitals  Enc Vitals Group     BP 01/24/16 1209 (!) 195/124     Pulse Rate 01/24/16 1209 77     Resp 01/24/16 1209 18     Temp 01/24/16 1209 98.2 F (36.8 C)     Temp Source 01/24/16 1209 Oral     SpO2 01/24/16 1209 100 %  Weight 01/24/16 1210 225 lb (102.1 kg)     Height 01/24/16 1210 6\' 1"  (1.854 m)     Head Circumference --      Peak Flow --      Pain Score 01/24/16 1216 0     Pain Loc --      Pain Edu? --      Excl. in [place]? --     Constitutional: Alert and oriented. Well appearing and in no acute distress. Eyes: Conjunctivae are normal. PERRL. EOMI. Head: Atraumatic. Nose: No congestion/rhinnorhea. Mouth/Throat: Mucous membranes are moist.  Oropharynx non-erythematous. Neck:  No stridor.  Cardiovascular: Normal rate, regular rhythm. Grossly normal heart sounds.  Good peripheral circulation. Respiratory: Normal respiratory effort.  No retractions. Lungs CTAB. Gastrointestinal: Soft and nontender. No distention. No abdominal bruits. No CVA tenderness. Musculoskeletal: No lower extremity tenderness nor edema.  No joint effusions. Neurologic:  Normal speech and language. No gross focal neurologic deficits are appreciated. No gait instability. Skin:  Skin is warm, dry and intact. No rash noted. Psychiatric: Mood and affect are normal. Speech and behavior are normal.  ____________________________________________   LABS (all labs ordered are listed, but only abnormal results are displayed)  Labs Reviewed  CBC - Abnormal; Notable for the following:       Result Value   HCT 39.9 (*)    All other components within normal limits  BASIC METABOLIC PANEL  TROPONIN I   ____________________________________________  EKG  EKG read and interpreted by me shows normal sinus rhythm at a rate of 70 normal axis nonspecific ST-T wave changes ____________________________________________  RADIOLOGY  Study Result   CLINICAL DATA:  Left-sided chest pressure, left arm pain.  EXAM: PORTABLE CHEST 1 VIEW  COMPARISON:  None.  FINDINGS: Heart and mediastinal contours are within normal limits. No focal opacities or effusions. No acute bony abnormality.  IMPRESSION: No active disease.   Electronically Signed   By: Rolm Baptise M.D.   On: 01/24/2016 12:45     ____________________________________________   PROCEDURES  Procedure(s) performed:   Procedures  Critical Care performed:   ____________________________________________   INITIAL IMPRESSION / ASSESSMENT AND PLAN / ED COURSE  Pertinent labs & imaging results that were available during my care of the patient were reviewed by me and considered in my medical decision making (see chart for  details).    Clinical Course      ____________________________________________   FINAL CLINICAL IMPRESSION(S) / ED DIAGNOSES  Final diagnoses:  Unstable angina (HCC)      NEW MEDICATIONS STARTED DURING THIS VISIT:  New Prescriptions   No medications on file     Note:  This document was prepared using Dragon voice recognition software and may include unintentional dictation errors.    Nena Polio, MD 01/24/16 [phone]

## 2016-01-24 NOTE — Progress Notes (Signed)
Patient walked to the bathroom and back. Site remains free of bleeding, surrounding tissue is soft.  Pedial pulses strong.

## 2016-01-25 DIAGNOSIS — N400 Enlarged prostate without lower urinary tract symptoms: Secondary | ICD-10-CM | POA: Diagnosis not present

## 2016-01-25 DIAGNOSIS — I208 Other forms of angina pectoris: Secondary | ICD-10-CM | POA: Diagnosis not present

## 2016-01-25 DIAGNOSIS — I10 Essential (primary) hypertension: Secondary | ICD-10-CM | POA: Diagnosis not present

## 2016-01-25 LAB — CBC
HCT: 38 % — ABNORMAL LOW (ref 40.0–52.0)
Hemoglobin: 12.6 g/dL — ABNORMAL LOW (ref 13.0–18.0)
MCH: 29.2 pg (ref 26.0–34.0)
MCHC: 33.1 g/dL (ref 32.0–36.0)
MCV: 88.4 fL (ref 80.0–100.0)
Platelets: 149 10*3/uL — ABNORMAL LOW (ref 150–440)
RBC: 4.3 MIL/uL — AB (ref 4.40–5.90)
RDW: 13.5 % (ref 11.5–14.5)
WBC: 5.1 10*3/uL (ref 3.8–10.6)

## 2016-01-25 LAB — BASIC METABOLIC PANEL
ANION GAP: 7 (ref 5–15)
BUN: 14 mg/dL (ref 6–20)
CO2: 26 mmol/L (ref 22–32)
Calcium: 8.7 mg/dL — ABNORMAL LOW (ref 8.9–10.3)
Chloride: 105 mmol/L (ref 101–111)
Creatinine, Ser: 0.94 mg/dL (ref 0.61–1.24)
Glucose, Bld: 92 mg/dL (ref 65–99)
Potassium: 3.5 mmol/L (ref 3.5–5.1)
SODIUM: 138 mmol/L (ref 135–145)

## 2016-01-25 MED ORDER — CLONIDINE HCL 0.2 MG PO TABS: 0.2000 mg | ORAL_TABLET | Freq: Two times a day (BID) | ORAL | 1 refills | Status: DC

## 2016-01-25 MED ORDER — LOSARTAN POTASSIUM 100 MG PO TABS: 100.0000 mg | ORAL_TABLET | Freq: Every day | ORAL | 2 refills | Status: DC

## 2016-01-25 MED ORDER — CARVEDILOL 6.25 MG PO TABS: 6.2500 mg | ORAL_TABLET | Freq: Two times a day (BID) | ORAL | Status: DC

## 2016-01-25 NOTE — Progress Notes (Signed)
Discharge: Pt d/c from room via wheelchair, Family member (wife0 with the pt. Discharge instructions given to the patient and family members.  No questions from pt, reintegrated to the pt to call or go to the ED for chest discomfort. Pt dressed in street clothes and left with discharge papers and prescriptions in hand. IV d/ced, tele removed and no complaints of pain or discomfort.

## 2016-01-25 NOTE — Progress Notes (Signed)
Patient reports that he "feels like" he fainted when he got up to use the bathroom, on his way to the bathroom, he "felt lightheaded and leaned against the bathroom door and came back to the bed and leaned against the bed rail and found my self to be on my knees next to the bed." Pt was asked why he did not call to notify staff right away and pt stated  "I checked my self and I was fine, and I started fixing things in the room." MD Marcille Blanco, Supervisor and Management have been notified. Neuro checks and VS are WNL, no fractures present. Pt denies any pain or fractures.

## 2016-01-25 NOTE — Progress Notes (Signed)
Pt alert and oriented x4, no complaints of pain or discomfort.  Bed in low position, call bell within reach.  Bed alarms on and functioning.  Assessment done and charted.  Will continue to monitor and do hourly rounding throughout the shift.  No complaints of pain or discomfort

## 2016-01-25 NOTE — Discharge Summary (Signed)
[facility] [place] NAME: Garrett Watkins    MR#:  [id number]  DATE OF BIRTH:  April 05, 1960  DATE OF ADMISSION:  01/24/2016   ADMITTING PHYSICIAN: Dustin Flock, MD  DATE OF DISCHARGE:  01/25/2016  PRIMARY CARE PHYSICIAN: Kirk Ruths., MD   ADMISSION DIAGNOSIS:   Unstable angina ([place]) [I20.0] NSTEMI (non-ST elevated myocardial infarction) ([place]) [I21.4]  DISCHARGE DIAGNOSIS:   Principal Problem:   NSTEMI (non-ST elevated myocardial infarction) [facility]) Active Problems:   Chest pain   SECONDARY DIAGNOSIS:   Past Medical History:  Diagnosis Date  . BPH (benign prostatic hyperplasia)   . Colon adenoma   . Dyslipidemia   . Hypertension   . OSA (obstructive sleep apnea)   . Prostate cancer [facility])     HOSPITAL COURSE:   56 year old male with past medical history significant for BPH, uncontrolled hypertension, sleep apnea presents to hospital secondary to chest pressure. Also noted to have elevated blood pressure.  #1 stable angina-secondary to hypertensive urgency. -Appreciate cardiology consult. Cardiac catheterization showing normal coronaries and EF of 60%. -Chest pressure relieved with blood pressure control.  #2 hypertensive urgency-has had trouble controlling his blood pressure as an outpatient. Blood pressure much improved on clonidine here in the hospital. -Discontinue his Coreg, hydrochlorothiazide and hydralazine. After discharge and clonidine and losartan. -Patient's family counseled on low-salt diet. They also have a blood pressure machine that they check regularly at home. Advised to take before giving the new medications and one hour after.  #3 BPH-on Flomax  #4 hyperlipidemia-on statin  Stable for discharge today  DISCHARGE CONDITIONS:   Stable  CONSULTS OBTAINED:   Treatment Team:  Corey Skains, MD  DRUG ALLERGIES:   Allergies  Allergen Reactions  . Amlodipine Other (See Comments)   edema   DISCHARGE MEDICATIONS:   Allergies as of 01/25/2016      Reactions   Amlodipine Other (See Comments)   edema      Medication List    STOP taking these medications   carvedilol 6.25 MG tablet Commonly known as:  COREG   hydrALAZINE 25 MG tablet Commonly known as:  APRESOLINE   losartan-hydrochlorothiazide 100-25 MG tablet Commonly known as:  HYZAAR     TAKE these medications   atorvastatin 20 MG tablet Commonly known as:  LIPITOR Take by mouth.   cloNIDine 0.2 MG tablet Commonly known as:  CATAPRES Take 1 tablet (0.2 mg total) by mouth 2 (two) times daily.   losartan 100 MG tablet Commonly known as:  COZAAR Take 1 tablet (100 mg total) by mouth daily.   MULTI VITAMIN DAILY PO Take by mouth.   tamsulosin 0.4 MG Caps capsule Commonly known as:  FLOMAX Take 1 capsule (0.4 mg total) by mouth daily.        DISCHARGE INSTRUCTIONS:   1. PCP follow-up for blood pressure monitoring in 1 week 2. Follow up with cardiology in 2 weeks  DIET:   Cardiac diet  ACTIVITY:   Activity as tolerated  OXYGEN:   Home Oxygen: No.  Oxygen Delivery: room air  DISCHARGE LOCATION:   home   If you experience worsening of your admission symptoms, develop shortness of breath, life threatening emergency, suicidal or homicidal thoughts you must seek medical attention immediately by calling 911 or calling your MD immediately  if symptoms less severe.  You Must read complete instructions/literature along with all the possible adverse reactions/side effects for all the Medicines you take and that have  been prescribed to you. Take any new Medicines after you have completely understood and accpet all the possible adverse reactions/side effects.   Please note  You were cared for by a hospitalist during your hospital stay. If you have any questions about your discharge medications or the care you received while you were in the hospital after you are discharged, you can call  the unit and asked to speak with the hospitalist on call if the hospitalist that took care of you is not available. Once you are discharged, your primary care physician will handle any further medical issues. Please note that NO REFILLS for any discharge medications will be authorized once you are discharged, as it is imperative that you return to your primary care physician (or establish a relationship with a primary care physician if you do not have one) for your aftercare needs so that they can reassess your need for medications and monitor your lab values.    On the day of Discharge:  VITAL SIGNS:   Blood pressure 129/70, pulse (!) 56, temperature 97.8 F (36.6 C), temperature source Oral, resp. rate 18, height 6\' 1"  (1.854 m), weight 104.3 kg (230 lb), SpO2 96 %.  PHYSICAL EXAMINATION:    GENERAL:  56 y.o.-year-old patient lying in the bed with no acute distress.  EYES: Pupils equal, round, reactive to light and accommodation. No scleral icterus. Extraocular muscles intact.  HEENT: Head atraumatic, normocephalic. Oropharynx and nasopharynx clear.  NECK:  Supple, no jugular venous distention. No thyroid enlargement, no tenderness.  LUNGS: Normal breath sounds bilaterally, no wheezing, rales,rhonchi or crepitation. No use of accessory muscles of respiration.  CARDIOVASCULAR: S1, S2 normal. No murmurs, rubs, or gallops.  ABDOMEN: Soft, non-tender, non-distended. Bowel sounds present. No organomegaly or mass.  EXTREMITIES: No pedal edema, cyanosis, or clubbing. Right groin without any hematoma or tenderness noted. NEUROLOGIC: Cranial nerves II through XII are intact. Muscle strength 5/5 in all extremities. Sensation intact. Gait not checked.  PSYCHIATRIC: The patient is alert and oriented x 3.  SKIN: No obvious rash, lesion, or ulcer.   DATA REVIEW:   CBC  Recent Labs Lab 01/25/16 0418  WBC 5.1  HGB 12.6*  HCT 38.0*  PLT 149*    Chemistries   Recent Labs Lab 01/25/16 0418    NA 138  K 3.5  CL 105  CO2 26  GLUCOSE 92  BUN 14  CREATININE 0.94  CALCIUM 8.7*     Microbiology Results  No results found for this or any previous visit.  RADIOLOGY:  Dg Chest Portable 1 View  Result Date: 01/24/2016 CLINICAL DATA:  Left-sided chest pressure, left arm pain. EXAM: PORTABLE CHEST 1 VIEW COMPARISON:  None. FINDINGS: Heart and mediastinal contours are within normal limits. No focal opacities or effusions. No acute bony abnormality. IMPRESSION: No active disease. Electronically Signed   By: Rolm Baptise M.D.   On: 01/24/2016 12:45     Management plans discussed with the patient, family and they are in agreement.  CODE STATUS:     Code Status Orders        Start     Ordered   01/24/16 1757  Full code  Continuous     01/24/16 1756    Code Status History    Date Active Date Inactive Code Status Order ID Comments User Context   01/24/2016  5:07 PM 01/24/2016  5:56 PM Full Code [id number]  Corey Skains, MD Inpatient      TOTAL TIME TAKING CARE  OF THIS PATIENT: 37 minutes.    Gladstone Lighter M.D on 01/25/2016 at 8:28 AM  Between 7am to 6pm - Pager - [phone]  After 6pm go to [email or web address] - Proofreader  [facility]  Office  [phone]  CC: Primary care physician; Kirk Ruths., MD   Note: This dictation was prepared with Dragon dictation along with smaller phrase technology. Any transcriptional errors that result from this process are unintentional.

## 2016-01-27 ENCOUNTER — Encounter: Payer: Self-pay | Admitting: Emergency Medicine

## 2016-01-27 ENCOUNTER — Emergency Department
Admission: EM | Admit: 2016-01-27 | Discharge: 2016-01-27 | Disposition: A | Discharge status: Home / Self Care | Payer: BLUE CROSS/BLUE SHIELD | Attending: Emergency Medicine | Admitting: Emergency Medicine

## 2016-01-27 ENCOUNTER — Emergency Department: Payer: BLUE CROSS/BLUE SHIELD

## 2016-01-27 DIAGNOSIS — Z79899 Other long term (current) drug therapy: Secondary | ICD-10-CM | POA: Insufficient documentation

## 2016-01-27 DIAGNOSIS — Z9889 Other specified postprocedural states: Secondary | ICD-10-CM | POA: Diagnosis not present

## 2016-01-27 DIAGNOSIS — I10 Essential (primary) hypertension: Secondary | ICD-10-CM | POA: Insufficient documentation

## 2016-01-27 DIAGNOSIS — G4733 Obstructive sleep apnea (adult) (pediatric): Secondary | ICD-10-CM | POA: Diagnosis not present

## 2016-01-27 DIAGNOSIS — Z8546 Personal history of malignant neoplasm of prostate: Secondary | ICD-10-CM | POA: Insufficient documentation

## 2016-01-27 DIAGNOSIS — R7989 Other specified abnormal findings of blood chemistry: Secondary | ICD-10-CM | POA: Diagnosis not present

## 2016-01-27 DIAGNOSIS — R0789 Other chest pain: Secondary | ICD-10-CM | POA: Diagnosis not present

## 2016-01-27 DIAGNOSIS — R778 Other specified abnormalities of plasma proteins: Secondary | ICD-10-CM

## 2016-01-27 DIAGNOSIS — R072 Precordial pain: Secondary | ICD-10-CM | POA: Diagnosis not present

## 2016-01-27 DIAGNOSIS — R079 Chest pain, unspecified: Secondary | ICD-10-CM | POA: Diagnosis not present

## 2016-01-27 LAB — CBC
HEMATOCRIT: 36.8 % — AB (ref 40.0–52.0)
HEMOGLOBIN: 12.3 g/dL — AB (ref 13.0–18.0)
MCH: 29.7 pg (ref 26.0–34.0)
MCHC: 33.5 g/dL (ref 32.0–36.0)
MCV: 88.6 fL (ref 80.0–100.0)
PLATELETS: 151 10*3/uL (ref 150–440)
RBC: 4.15 MIL/uL — AB (ref 4.40–5.90)
RDW: 13.6 % (ref 11.5–14.5)
WBC: 5 10*3/uL (ref 3.8–10.6)

## 2016-01-27 LAB — BASIC METABOLIC PANEL
ANION GAP: 8 (ref 5–15)
BUN: 18 mg/dL (ref 6–20)
CHLORIDE: 104 mmol/L (ref 101–111)
CO2: 28 mmol/L (ref 22–32)
Calcium: 9.2 mg/dL (ref 8.9–10.3)
Creatinine, Ser: 1.13 mg/dL (ref 0.61–1.24)
GFR calc Af Amer: >60 mL/min (ref >60)
GFR calc non Af Amer: >60 mL/min (ref >60)
Glucose, Bld: 95 mg/dL (ref 65–99)
POTASSIUM: 3.3 mmol/L — AB (ref 3.5–5.1)
Sodium: 140 mmol/L (ref 135–145)

## 2016-01-27 LAB — TROPONIN I
Troponin I: 0.04 ng/mL (ref <0.03)
Troponin I: 0.03 ng/mL (ref <0.03)

## 2016-01-27 MED ORDER — NITROGLYCERIN 2 % TD OINT: 1.0000 [in_us] | TOPICAL_OINTMENT | Freq: Once | TRANSDERMAL | Status: DC

## 2016-01-27 MED ORDER — CLONIDINE HCL 0.1 MG PO TABS
0.2000 mg | ORAL_TABLET | Freq: Once | ORAL | Status: AC
  Administered 2016-01-27: 0.2 mg via ORAL
  Filled 2016-01-27: qty 2

## 2016-01-27 MED ORDER — NITROGLYCERIN 2 % TD OINT
1.0000 [in_us] | TOPICAL_OINTMENT | Freq: Once | TRANSDERMAL | Status: AC
  Administered 2016-01-27: 1 [in_us] via TOPICAL
  Filled 2016-01-27: qty 1

## 2016-01-27 NOTE — ED Notes (Signed)
Dr. Cinda Quest notified of troponin 0.04

## 2016-01-27 NOTE — ED Triage Notes (Signed)
Pt via POV with c/o of tightness in left chest with nausea and pain radiating to left arm.  Pt was admitted on Friday and had recent cardiac cath Only as an exploratory to check to see if the HTN was caused by blockage.

## 2016-01-27 NOTE — Discharge Instructions (Signed)
Please follow-up with Dr. Nehemiah Massed the cardiologist and with Dr. Ouida Sills. I would see if they can see later today or tomorrow to keep an eye your blood pressure and see if they need to adjust her blood pressure medications. Garrett Watkins's return for any further problems

## 2016-01-27 NOTE — ED Provider Notes (Signed)
[facility] Emergency Department Provider Note   ____________________________________________   First MD Initiated Contact with Patient 01/27/16 [phone]     (approximate)  I have reviewed the triage vital signs and the nursing notes.   HISTORY  Chief Complaint Chest Pain    HPI Garrett Watkins is a 56 y.o. male who came in on Friday with hypertension and chest pain had a cardiac catheter that showed normal coronary arteries was discharged on clonidine and losartan. Patient had recurrence of the chest pain nausea and left arm feeling funny and blood pressure being elevated again.   Past Medical History:  Diagnosis Date  . BPH (benign prostatic hyperplasia)   . Colon adenoma   . Dyslipidemia   . Hypertension   . OSA (obstructive sleep apnea)   . Prostate cancer [facility])     Patient Active Problem List   Diagnosis Date Noted  . Chest pain 01/24/2016  . NSTEMI (non-ST elevated myocardial infarction) ([place]) 01/24/2016  . Elevated PSA 12/30/2015  . Urinary frequency 12/30/2015  . BPH (benign prostatic hyperplasia) 11/27/2015  . Dyslipidemia 11/27/2015  . Essential hypertension 11/27/2015  . Hyperlipidemia 11/27/2015  . Hypertension 11/27/2015  . Snoring 11/27/2015  . Colon adenoma 01/21/2012    Past Surgical History:  Procedure Laterality Date  . CYST EXCISION    . NO PAST SURGERIES    . VASECTOMY      Prior to Admission medications   Medication Sig Start Date End Date Taking? Authorizing Provider  atorvastatin (LIPITOR) 20 MG tablet Take by mouth. 05/16/15   Historical Provider, MD  cloNIDine (CATAPRES) 0.2 MG tablet Take 1 tablet (0.2 mg total) by mouth 2 (two) times daily. 01/25/16   Gladstone Lighter, MD  losartan (COZAAR) 100 MG tablet Take 1 tablet (100 mg total) by mouth daily. 01/25/16   Gladstone Lighter, MD  Multiple Vitamin (MULTI VITAMIN DAILY PO) Take by mouth. 03/01/08   Historical Provider, MD  tamsulosin (FLOMAX) 0.4 MG CAPS capsule Take  1 capsule (0.4 mg total) by mouth daily. 01/16/16   Nickie Retort, MD    Allergies Amlodipine  Family History  Problem Relation Age of Onset  . CAD Mother   . Prostate cancer Neg Hx     Social History Social History  Substance Use Topics  . Smoking status: Never Smoker  . Smokeless tobacco: Never Used  . Alcohol use No    Review of Systems Constitutional: No fever/chills Eyes: No visual changes. ENT: No sore throat. Cardiovascular:  chest pain. Respiratory: Denies shortness of breath. Gastrointestinal: No abdominal pain.  No nausea, no vomiting.  No diarrhea.  No constipation. Genitourinary: Negative for dysuria. Musculoskeletal: Negative for back pain. Skin: Negative for rash. Neurological: Negative for headaches, focal weakness or numbness.  10-point ROS otherwise negative.  ____________________________________________   PHYSICAL EXAM:  VITAL SIGNS: ED Triage Vitals  Enc Vitals Group     BP 01/27/16 0439 (!) 179/88     Pulse Rate 01/27/16 0439 72     Resp 01/27/16 0439 18     Temp 01/27/16 0439 98 F (36.7 C)     Temp Source 01/27/16 0439 Oral     SpO2 01/27/16 0439 96 %     Weight 01/27/16 0439 230 lb (104.3 kg)     Height 01/27/16 0439 6\' 1"  (1.854 m)     Head Circumference --      Peak Flow --      Pain Score 01/27/16 0453 3  Pain Loc --      Pain Edu? --      Excl. in [place]? --     Constitutional: Alert and oriented. Well appearing and in no acute distress. Eyes: Conjunctivae are normal. PERRL. EOMI. Head: Atraumatic. Nose: No congestion/rhinnorhea. Mouth/Throat: Mucous membranes are moist.  Oropharynx non-erythematous. Neck: No stridor.  Cardiovascular: Normal rate, regular rhythm. Grossly normal heart sounds.  Good peripheral circulation. Respiratory: Normal respiratory effort.  No retractions. Lungs CTAB. Gastrointestinal: Soft and nontender. No distention. No abdominal bruits. Musculoskeletal: No lower extremity tenderness nor edema.   No joint effusions. Neurologic:  Normal speech and language. No gross focal neurologic deficits are appreciated. No gait instability. Skin:  Skin is warm, dry and intact. No rash noted.   ____________________________________________   LABS (all labs ordered are listed, but only abnormal results are displayed)  Labs Reviewed  BASIC METABOLIC PANEL - Abnormal; Notable for the following:       Result Value   Potassium 3.3 (*)    All other components within normal limits  TROPONIN I - Abnormal; Notable for the following:    Troponin I 0.04 (*)    All other components within normal limits  CBC - Abnormal; Notable for the following:    RBC 4.15 (*)    Hemoglobin 12.3 (*)    HCT 36.8 (*)    All other components within normal limits  TROPONIN I   ____________________________________________  EKG  EKG read and interpreted by me shows normal sinus rhythm a rate of 69 normal axis essentially normal EKG ____________________________________________  RADIOLOGY  Study Result   CLINICAL DATA:  Awoke with chest pain.  Pain radiates to left arm.  EXAM: CHEST  2 VIEW  COMPARISON:  Chest radiograph 01/24/2016  FINDINGS: The cardiomediastinal contours are normal. The lungs are clear. Pulmonary vasculature is normal. No consolidation, pleural effusion, or pneumothorax. No acute osseous abnormalities are seen.  IMPRESSION: No active cardiopulmonary disease.   Electronically Signed   By: Jeb Levering M.D.   On: 01/27/2016 05:34     ____________________________________________   PROCEDURES  Procedure(s) performed:  Procedures  Critical Care performed:  ____________________________________________   INITIAL IMPRESSION / ASSESSMENT AND PLAN / ED COURSE  Pertinent labs & imaging results that were available during my care of the patient were reviewed by me and considered in my medical decision making (see chart for details).    Clinical Course      Discussed in detail with Dr. Tomasita Crumble shows who is on-call for critical clinic cardiology. In view of the negative completely normal catheter just a couple days ago we will have him follow-up with Dr. Ouida Sills his primary care doc and also with Dr. Marlene Lard in the office. Troponin is trending down some likely to be any kind of coronary artery disease however his blood pressure will need further management. On discharge was 155/99 ____________________________________________   FINAL CLINICAL IMPRESSION(S) / ED DIAGNOSES  Final diagnoses:  Chest pain, unspecified type  Hypertension, unspecified type  Elevated troponin      NEW MEDICATIONS STARTED DURING THIS VISIT:  New Prescriptions   No medications on file     Note:  This document was prepared using Dragon voice recognition software and may include unintentional dictation errors.    Nena Polio, MD 01/27/16 [phone]

## 2016-01-29 ENCOUNTER — Ambulatory Visit: Payer: BLUE CROSS/BLUE SHIELD | Admitting: Radiation Oncology

## 2016-01-31 DIAGNOSIS — Z9989 Dependence on other enabling machines and devices: Secondary | ICD-10-CM | POA: Diagnosis not present

## 2016-01-31 DIAGNOSIS — R072 Precordial pain: Secondary | ICD-10-CM | POA: Diagnosis not present

## 2016-01-31 DIAGNOSIS — G4733 Obstructive sleep apnea (adult) (pediatric): Secondary | ICD-10-CM | POA: Diagnosis not present

## 2016-01-31 DIAGNOSIS — I10 Essential (primary) hypertension: Secondary | ICD-10-CM | POA: Diagnosis not present

## 2016-02-04 ENCOUNTER — Encounter: Payer: Self-pay | Admitting: Radiation Oncology

## 2016-02-04 ENCOUNTER — Ambulatory Visit
Admission: RE | Admit: 2016-02-04 | Discharge: 2016-02-04 | Disposition: A | Discharge status: Home / Self Care | Payer: BLUE CROSS/BLUE SHIELD | Source: Ambulatory Visit | Attending: Radiation Oncology | Admitting: Radiation Oncology

## 2016-02-04 VITALS — BP 178/98 | HR 74 | Temp 96.1°F | Resp 18 | Wt 229.4 lb

## 2016-02-04 DIAGNOSIS — I10 Essential (primary) hypertension: Secondary | ICD-10-CM | POA: Insufficient documentation

## 2016-02-04 DIAGNOSIS — E785 Hyperlipidemia, unspecified: Secondary | ICD-10-CM | POA: Diagnosis not present

## 2016-02-04 DIAGNOSIS — Z86010 Personal history of colonic polyps: Secondary | ICD-10-CM | POA: Diagnosis not present

## 2016-02-04 DIAGNOSIS — Z79899 Other long term (current) drug therapy: Secondary | ICD-10-CM | POA: Insufficient documentation

## 2016-02-04 DIAGNOSIS — C61 Malignant neoplasm of prostate: Secondary | ICD-10-CM | POA: Diagnosis not present

## 2016-02-04 DIAGNOSIS — G4730 Sleep apnea, unspecified: Secondary | ICD-10-CM | POA: Insufficient documentation

## 2016-02-04 DIAGNOSIS — N400 Enlarged prostate without lower urinary tract symptoms: Secondary | ICD-10-CM | POA: Diagnosis not present

## 2016-02-04 NOTE — Consult Note (Signed)
NEW PATIENT EVALUATION  Name: Garrett Watkins  MRN: [id number]  Date:   02/04/2016     DOB: 07/08/1960   This 56 y.o. male patient presents to the clinic for initial evaluation of stage I prostate cancer Gleason score of 7 (3+4) presenting with a PSA of 4.1.  REFERRING PHYSICIAN: Kirk Ruths, MD  CHIEF COMPLAINT:  Chief Complaint  Patient presents with  . Prostate Cancer    Pt is here for initial consultation of prostate cancer.      DIAGNOSIS: The encounter diagnosis was Malignant neoplasm of prostate ([place]).   PREVIOUS INVESTIGATIONS:  Pathology reports reviewed Clinical notes reviewed  Bone scan not ordered based on low PSA   HPI: Patient is a pleasant 56 year old male who is been followed for minor lower urinary tract symptoms including frequency urgency and nocturia and has been placed on Flomax. He was noted back in 2014 tablet PSA of 1.3 which is slowly climbed most recently November 2017 up to 4.1. This prompt transrectal ultrasound-guided biopsy. Prostate was measured at 43 cc. Biopsy was also positive for 3 out of 12 cores positive for mostly Gleason 6 adenocarcinoma with 1 core with a Gleason 7 (3+4). Patient does have hypertension which is being evaluated. He is been seen by urology with discussion of treatment options available is seeing urology next week for discussion about robotic prostatectomy. He is otherwise doing well is seen today for radiation oncology opinion.  PLANNED TREATMENT REGIMEN: Prostatectomy versus radiation therapy  PAST MEDICAL HISTORY:  has a past medical history of BPH (benign prostatic hyperplasia); Colon adenoma; Dyslipidemia; Hypertension; OSA (obstructive sleep apnea); and Prostate cancer ([place]).    PAST SURGICAL HISTORY:  Past Surgical History:  Procedure Laterality Date  . CARDIAC CATHETERIZATION Right 01/24/2016   Procedure: Left Heart Cath and Coronary Angiography;  Surgeon: Corey Skains, MD;  Location: [facility];   Service: Cardiovascular;  Laterality: Right;  . CYST EXCISION    . NO PAST SURGERIES    . VASECTOMY      FAMILY HISTORY: family history includes CAD in his mother.  SOCIAL HISTORY:  reports that he has never smoked. He has never used smokeless tobacco. He reports that he does not drink alcohol or use drugs.  ALLERGIES: Amlodipine  MEDICATIONS:  Current Outpatient Prescriptions  Medication Sig Dispense Refill  . carvedilol (COREG) 6.25 MG tablet Take by mouth.    . hydrochlorothiazide (HYDRODIURIL) 25 MG tablet Take by mouth.    . Multiple Vitamin (MULTI VITAMIN DAILY PO) Take by mouth.    . tamsulosin (FLOMAX) 0.4 MG CAPS capsule Take 1 capsule (0.4 mg total) by mouth daily. 30 capsule 11  . telmisartan (MICARDIS) 80 MG tablet Take by mouth.    Marland Kitchen atorvastatin (LIPITOR) 20 MG tablet Take by mouth.    . cloNIDine (CATAPRES) 0.2 MG tablet Take 1 tablet (0.2 mg total) by mouth 2 (two) times daily. (Patient not taking: Reported on 02/04/2016) 60 tablet 1  . hydrALAZINE (APRESOLINE) 25 MG tablet TAKE 1 TABLET (25 MG TOTAL) BY MOUTH 2 (TWO) TIMES DAILY.  11  . losartan (COZAAR) 100 MG tablet Take 1 tablet (100 mg total) by mouth daily. (Patient not taking: Reported on 02/04/2016) 30 tablet 2  . losartan-hydrochlorothiazide (HYZAAR) 100-25 MG tablet Take 1 tablet by mouth daily.  11  . Pseudoeph-Doxylamine-DM-APAP (NYQUIL PO) Take 1 Dose by mouth at bedtime.     No current facility-administered medications for this encounter.     ECOG PERFORMANCE  STATUS:  0 - Asymptomatic  REVIEW OF SYSTEMS: Except for the problems with hypertension and increased lower urinary tract symptoms Patient denies any weight loss, fatigue, weakness, fever, chills or night sweats. Patient denies any loss of vision, blurred vision. Patient denies any ringing  of the ears or hearing loss. No irregular heartbeat. Patient denies heart murmur or history of fainting. Patient denies any chest pain or pain radiating to her  upper extremities. Patient denies any shortness of breath, difficulty breathing at night, cough or hemoptysis. Patient denies any swelling in the lower legs. Patient denies any nausea vomiting, vomiting of blood, or coffee ground material in the vomitus. Patient denies any stomach pain. Patient states has had normal bowel movements no significant constipation or diarrhea. Patient denies any dysuria, hematuria or significant nocturia. Patient denies any problems walking, swelling in the joints or loss of balance. Patient denies any skin changes, loss of hair or loss of weight. Patient denies any excessive worrying or anxiety or significant depression. Patient denies any problems with insomnia. Patient denies excessive thirst, polyuria, polydipsia. Patient denies any swollen glands, patient denies easy bruising or easy bleeding. Patient denies any recent infections, allergies or URI. Patient "s visual fields have not changed significantly in recent time.    PHYSICAL EXAM: BP (!) 178/98   Pulse 74   Temp (!) 96.1 F (35.6 C)   Resp 18   Wt 229 lb 6.2 oz (104.1 kg)   BMI 30.26 kg/m  On rectal exam prostate is smooth without evidence of nodularity or mass. Sulcus is preserved bilaterally. No clinically detectable nodularity is noted. Remainder of rectal exam is unremarkable. Well-developed well-nourished patient in NAD. HEENT reveals PERLA, EOMI, discs not visualized.  Oral cavity is clear. No oral mucosal lesions are identified. Neck is clear without evidence of cervical or supraclavicular adenopathy. Lungs are clear to A&P. Cardiac examination is essentially unremarkable with regular rate and rhythm without murmur rub or thrill. Abdomen is benign with no organomegaly or masses noted. Motor sensory and DTR levels are equal and symmetric in the upper and lower extremities. Cranial nerves II through XII are grossly intact. Proprioception is intact. No peripheral adenopathy or edema is identified. No motor or  sensory levels are noted. Crude visual fields are within normal range.  LABORATORY DATA: Pathology reports reviewed    RADIOLOGY RESULTS: No bone scan ordered based on low PSA score   IMPRESSION: Stage I adenocarcinoma the prostate (T1 CN 79 M36) in 56 year old male with 1 core positive for Gleason 7 (3+4) adenocarcinoma  PLAN: At this time I have a long discussion with the patient and his wife about the risks and benefits of both external beam radiation therapy as well as I-125 interstitial implant. My strong recommendation if he decides on radiation would be for I-125 interstitial implant based on his young age and ability to deliver such a higher dose with interstitial implant. We've also discussed robotic prostatectomy and will he will have a formal discussion with urologist next week about that treatment option. I have set him up for 2 week follow-up should he opt for radiation therapy will see him at that time for further discussion as far as volume study and implant are concerned. Should he opt for prostatectomy he will follow up with urology.  I would like to take this opportunity to thank you for allowing me to participate in the care of your patient.Armstead Peaks., MD

## 2016-02-10 DIAGNOSIS — I10 Essential (primary) hypertension: Secondary | ICD-10-CM | POA: Diagnosis not present

## 2016-02-24 ENCOUNTER — Ambulatory Visit (INDEPENDENT_AMBULATORY_CARE_PROVIDER_SITE_OTHER): Payer: BLUE CROSS/BLUE SHIELD | Admitting: Urology

## 2016-02-24 ENCOUNTER — Encounter: Payer: Self-pay | Admitting: Urology

## 2016-02-24 DIAGNOSIS — C61 Malignant neoplasm of prostate: Secondary | ICD-10-CM | POA: Diagnosis not present

## 2016-02-24 DIAGNOSIS — R3915 Urgency of urination: Secondary | ICD-10-CM

## 2016-02-24 NOTE — Progress Notes (Signed)
02/24/2016 6:21 AM   Garrett Watkins 05-10-1960 [id number]  Referring provider: Kirk Ruths, MD [facility] [place], [place] [address]  CC: follow up prostate cancer  HPI: 1 - Moderate Risk Prostate Cancer - Gleason 3+4=7 and 3+3=6 in up to 53% of RMB, RLM, RLB (3/12) by biopsy 12/2015. TRUS 48mL on eval PA rising to 4.14. Had opinion from Dr. Donella Stade with rad-onc with consideration of brachy.   2 - Lower Urinary Tract Symptoms / Nocturia - pt with modest bother from urgency / freqeuncy with nocturia x 4 now improved on tamsulosin.   PMH sig for NSTEMI (favorable cath 2018, only mild strain with HTN urgency, not limiting whatsoever), OSA.  Today "Garrett Watkins" is seen in f/u above. He had interval HTN urgency with mild tropI leak but favorable cath as well as productive meeting with Dr. Donella Stade.    PMH: Past Medical History:  Diagnosis Date  . BPH (benign prostatic hyperplasia)   . Colon adenoma   . Dyslipidemia   . Hypertension   . OSA (obstructive sleep apnea)   . Prostate cancer [facility])     Surgical History: Past Surgical History:  Procedure Laterality Date  . CARDIAC CATHETERIZATION Right 01/24/2016   Procedure: Left Heart Cath and Coronary Angiography;  Surgeon: Corey Skains, MD;  Location: [facility];  Service: Cardiovascular;  Laterality: Right;  . CYST EXCISION    . NO PAST SURGERIES    . VASECTOMY      Home Medications:  Allergies as of 02/24/2016      Reactions   Amlodipine Other (See Comments)   edema      Medication List       Accurate as of 02/24/16  6:21 AM. Always use your most recent med list.          atorvastatin 20 MG tablet Commonly known as:  LIPITOR Take by mouth.   carvedilol 6.25 MG tablet Commonly known as:  COREG Take by mouth.   cloNIDine 0.2 MG tablet Commonly known as:  CATAPRES Take 1 tablet (0.2 mg total) by mouth 2 (two) times daily.   hydrALAZINE 25 MG tablet Commonly known  as:  APRESOLINE TAKE 1 TABLET (25 MG TOTAL) BY MOUTH 2 (TWO) TIMES DAILY.   hydrochlorothiazide 25 MG tablet Commonly known as:  HYDRODIURIL Take by mouth.   losartan 100 MG tablet Commonly known as:  COZAAR Take 1 tablet (100 mg total) by mouth daily.   losartan-hydrochlorothiazide 100-25 MG tablet Commonly known as:  HYZAAR Take 1 tablet by mouth daily.   MULTI VITAMIN DAILY PO Take by mouth.   NYQUIL PO Take 1 Dose by mouth at bedtime.   tamsulosin 0.4 MG Caps capsule Commonly known as:  FLOMAX Take 1 capsule (0.4 mg total) by mouth daily.   telmisartan 80 MG tablet Commonly known as:  MICARDIS Take by mouth.       Allergies:  Allergies  Allergen Reactions  . Amlodipine Other (See Comments)    edema    Family History: Family History  Problem Relation Age of Onset  . CAD Mother   . Prostate cancer Neg Hx     Social History:  reports that he has never smoked. He has never used smokeless tobacco. He reports that he does not drink alcohol or use drugs.    Review of Systems  Gastrointestinal (upper)  : Negative for upper GI symptoms  Gastrointestinal (lower) : Negative for lower GI symptoms  Constitutional : Negative for symptoms  Skin: Negative for skin symptoms  Eyes: Negative for eye symptoms  Ear/Nose/Throat : Negative for Ear/Nose/Throat symptoms  Hematologic/Lymphatic: Negative for Hematologic/Lymphatic symptoms  Cardiovascular : Negative for cardiovascular symptoms  Respiratory : Negative for respiratory symptoms  Endocrine: Negative for endocrine symptoms  Musculoskeletal: Negative for musculoskeletal symptoms  Neurological: Negative for neurological symptoms  Psychologic: Negative for psychiatric symptoms  Physical Exam: There were no vitals taken for this visit.  Constitutional:  Alert and oriented, No acute distress. HEENT: [place] AT, moist mucus membranes.  Trachea midline, no masses. Cardiovascular: No clubbing,  cyanosis, or edema. Respiratory: Normal respiratory effort, no increased work of breathing. GI: Abdomen is soft, nontender, nondistended, no abdominal masses GU: No CVA tenderness.  Skin: No rashes, bruises or suspicious lesions. Lymph: No cervical or inguinal adenopathy. Neurologic: Grossly intact, no focal deficits, moving all 4 extremities. Psychiatric: Normal mood and affect.  Laboratory Data: Lab Results  Component Value Date   WBC 5.0 01/27/2016   HGB 12.3 (L) 01/27/2016   HCT 36.8 (L) 01/27/2016   MCV 88.6 01/27/2016   PLT 151 01/27/2016    Lab Results  Component Value Date   CREATININE 1.13 01/27/2016    No results found for: PSA  No results found for: TESTOSTERONE  No results found for: HGBA1C  Urinalysis No results found for: COLORURINE, APPEARANCEUR, LABSPEC, PHURINE, GLUCOSEU, HGBUR, BILIRUBINUR, KETONESUR, PROTEINUR, UROBILINOGEN, NITRITE, LEUKOCYTESUR   Assessment & Plan:    1 - Moderate Risk Prostate Cancer - significnt disease in younger man with well controlled comorbidty. I feel he would do well with surgery or radiation and agree brachy is attractive option as far as radiation goes. He is leaning towards surgery, but 4-6 weeks off work makes him nervous.   He and his wife are still considering, we all agree that taking some time to make in formed decision is crucial but that decision and treatment should ideally occur within next 6 mos to avoid need for repeat biopsy.   2 - Lower Urinary Tract Symptoms / Nocturia - continue tamsulosin for now.  RTC 6 weeks or he may call with final decision prior.   Alexis Frock, [facility] [address], [place], [place] [address] [phone]

## 2016-03-03 DIAGNOSIS — C61 Malignant neoplasm of prostate: Secondary | ICD-10-CM | POA: Diagnosis not present

## 2016-03-05 ENCOUNTER — Ambulatory Visit: Payer: BLUE CROSS/BLUE SHIELD | Admitting: Radiation Oncology

## 2016-03-16 DIAGNOSIS — G4733 Obstructive sleep apnea (adult) (pediatric): Secondary | ICD-10-CM | POA: Diagnosis not present

## 2016-03-16 DIAGNOSIS — R001 Bradycardia, unspecified: Secondary | ICD-10-CM | POA: Diagnosis not present

## 2016-03-16 DIAGNOSIS — Z9989 Dependence on other enabling machines and devices: Secondary | ICD-10-CM | POA: Diagnosis not present

## 2016-03-16 DIAGNOSIS — I10 Essential (primary) hypertension: Secondary | ICD-10-CM | POA: Diagnosis not present

## 2016-03-18 ENCOUNTER — Other Ambulatory Visit: Payer: Self-pay | Admitting: Radiology

## 2016-03-18 ENCOUNTER — Telehealth: Payer: Self-pay | Admitting: Radiology

## 2016-03-18 DIAGNOSIS — C61 Malignant neoplasm of prostate: Secondary | ICD-10-CM

## 2016-03-18 NOTE — Telephone Encounter (Signed)
Pt states he would like to proceed with nerve sparing prostatectomy. Please advise.

## 2016-03-19 ENCOUNTER — Other Ambulatory Visit: Payer: Self-pay | Admitting: Radiology

## 2016-03-19 DIAGNOSIS — C61 Malignant neoplasm of prostate: Secondary | ICD-10-CM

## 2016-03-19 NOTE — Telephone Encounter (Signed)
Notified pt of surgery scheduled 04/21/16 with Dr Tresa Moore, pre-admit testing appt on 04/10/16 @9 :00, & to call day prior to surgery for arrival time to SDS. Questions were answered. Pt states he does not take ASA or any other blood thinners & voices understanding of instructions.

## 2016-03-20 ENCOUNTER — Ambulatory Visit: Payer: BLUE CROSS/BLUE SHIELD | Attending: Urology | Admitting: Physical Therapy

## 2016-03-20 DIAGNOSIS — M6281 Muscle weakness (generalized): Secondary | ICD-10-CM

## 2016-03-20 DIAGNOSIS — R278 Other lack of coordination: Secondary | ICD-10-CM

## 2016-03-20 NOTE — Patient Instructions (Signed)
Open book (handout)   To relax back muscles to get a more diaphragmatic expansion at ribcage     PELVIC FLOOR / KEGEL EXERCISES   Pelvic floor/ Kegel exercises are used to strengthen the muscles in the base of your pelvis that are responsible for supporting your pelvic organs and preventing urine/feces leakage. Based on your therapist's recommendations, they can be performed while standing, sitting, or lying down.  Make yourself aware of this muscle group by using these cues:  Imagine you are in a crowded room and you feel the need to pass gas. Your response is to pull up and in at the rectum.  Close the rectum. Pull the muscles up inside your body,feeling your scrotum lifting as well . Feel the pelvic floor muscles lift as if you were walking into a cold lake.  Place your hand on top of your pubic bone. Tighten and draw in the muscles around the anal muscles without squeezing the buttock muscles.  Common Errors:  Breath holding: If you are holding your breath, you may be bearing down against your bladder instead of pulling it up. If you belly bulges up while you are squeezing, you are holding your breath. Be sure to breathe gently in and out while exercising. Counting out loud may help you avoid holding your breath.  Accessory muscle use: You should not see or feel other muscle movement when performing pelvic floor exercises. When done properly, no one can tell that you are performing the exercises. Keep the buttocks, belly and inner thighs relaxed.  Overdoing it: Your muscles can fatigue and stop working for you if you over-exercise. You may actually leak more or feel soreness at the lower abdomen or rectum.  YOUR HOME EXERCISE PROGRAM   SHORT HOLDS: Position: on back, sitting   Inhale and then exhale. Then squeeze the muscle.  (Be sure to let belly sink in with exhales and not push outward)  Perform 5 repetitions, 5  Times/day  **ALSO SQUEEZE BEFORE YOUR SNEEZE, COUGH, LAUGH to  decrease downward pressure   **ALSO EXHALE BEFORE YOU RISE AGAINST GRAVITY (lifting, sit to stand, from squat to stand)

## 2016-03-21 NOTE — Therapy (Addendum)
[place] MAIN [facility] SERVICES [address] [place], [place], [address] Phone: [phone]   Fax:  [phone]  Physical Therapy Evaluation  Patient Details  Name: Garrett Watkins MRN: [id number] Date of Birth: 06-18-1960 Referring Provider: Tresa Moore  Encounter Date: 03/20/2016      PT End of Session - 03/22/16 1746    Visit Number 1   Number of Visits 12   PT Start Time 0905   PT Stop Time 1015   PT Time Calculation (min) 70 min   Activity Tolerance Patient tolerated treatment well   Behavior During Therapy [facility] for tasks assessed/performed      Past Medical History:  Diagnosis Date  . BPH (benign prostatic hyperplasia)   . Colon adenoma   . Dyslipidemia   . Hypertension   . OSA (obstructive sleep apnea)   . Prostate cancer [facility])     Past Surgical History:  Procedure Laterality Date  . CARDIAC CATHETERIZATION Right 01/24/2016   Procedure: Left Heart Cath and Coronary Angiography;  Surgeon: Corey Skains, MD;  Location: [place] [facility];  Service: Cardiovascular;  Laterality: Right;  . CYST EXCISION    . NO PAST SURGERIES    . VASECTOMY      There were no vitals filed for this visit.       Subjective Assessment - 03/22/16 1741    Subjective Pt is scheduled for prostate surgery is scheduled on 04/21/16 with pre-op on 3/30.  Pt denied urinary leakage currently. Pt has regular bowel movements and tries not to strain.  Pt has had LBP in the past with improper alignment when lifting weights. Pt has a regular weight lifting routine with sit-ups and crunches. Loaded activities: weight lifting 3x week, shoulders chest, biceps, triceps stomach)  incline sit ups 350-400 x reps and crunches reps 80lb-90lb 10-12 reps x 4 set  , cardio (running, walking on incline 40 min)     Patient Stated Goals to minimize rinary leakage following surgery            [facility] PT Assessment - 03/22/16 1744      Assessment   Medical Diagnosis Prostate  cancer   Referring Provider Garrett Watkins     Precautions   Precautions None     Restrictions   Weight Bearing Restrictions No     Balance Screen   Has the patient fallen in the past 6 months No     Observation/Other Assessments   Observations ankles under chair, poor postural engagement     Coordination   Gross Motor Movements are Fluid and Coordinated --  chest breathing, limited pelvic floor movement   Fine Motor Movements are Fluid and Coordinated --  increased diaphragmatic excursion following manual Tx     Other:   Other/ Comments breathholding with loaded activities     Palpation   Spinal mobility increased thoracic mm tensions, hypomobility .    Palpation comment no pelvic floo tensions/tenderness                 Pelvic Floor Special Questions - 03/22/16 1743    External Perineal Exam through clothing: adductor overuse with cue for pelvic floor contraction  5 reps , 1 sec holds in hooklying/ seated with tactile cues           [facility] Adult PT Treatment/Exercise - 03/22/16 1751      Therapeutic Activites    Therapeutic Activities --  see pt instructions     Neuro Re-ed  Neuro Re-ed Details  see pt instructions     Manual Therapy   Manual therapy comments MWM, STM thoracic area  B                PT Education - 03/22/16 1746    Education provided Yes   Education Details POC, anatomy. physiology, goals, HEP, pelvic floor involvement with weight training/ sit ups/ crunches, pelvic floor coordination    Person(s) Educated Patient   Methods Explanation;Demonstration;Tactile cues;Verbal cues;Handout   Comprehension Returned demonstration;Verbalized understanding;Verbal cues required;Tactile cues required             PT Long Term Goals - 03/22/16 1752      PT LONG TERM GOAL #1   Title Pt will demo no adductor overuse with cue for pelvic floor contraction in order to properly activate his pelvic floor mm to minimize leakage 2/2 surgery    Time 12   Period Weeks   Status New     PT LONG TERM GOAL #2   Title Pt will demo proper diaphragmatic excursion with pelvic floor coordination in order to progress to pelvic floor training   Time 12   Period Weeks   Status New     PT LONG TERM GOAL #3   Title Pt will demo pelvic floor strength Grade 3/3/3/3 in order to advance to endurance training for long term continence   Time 12   Period Weeks   Status New     PT LONG TERM GOAL #4   Title Pt will demo proper technique to alternative to sit up and crunches to minimize load on his pelvic floor and urinary leakage   Time 12   Period Weeks   Status New               Plan - 03/22/16 1747    Clinical Impression Statement Pt is a 56 yo male who is scheduled for  prostate surgery on 04/21/16. Pt demo'd poor knowledge of pelvic floor  training to minimize urinary leakage 2/2 post-prostectomy.  Pt demo'd  adductor overuse as a compensatory mechanicism to pelvic floor activation.  Pt also showed limited diaphragmatic excursion with increased thoracic mm  tensions/ hypomobility. Pt demo'd increased pelvic floor lengthening  following manual Tx at the thoracic area and correct technique in supine  and seated for pelvic floor quick contraction strengthening. Pt was  educated on ways to minimize load on pelvic floor mm w/ sit to stand and getting out of bed and was advised to  return to PT once cleared from MD s/p surgery to gradually return to his  weightlifting routine and way to replace sit ups and crunches to preserve  health of pelvic floor function.            PT Frequency 1x / week   PT Duration 12 weeks   PT Treatment/Interventions Neuromuscular re-education;Therapeutic exercise;Therapeutic activities;Moist Heat;Energy conservation;Patient/family education;Scar mobilization   Consulted and Agree with Plan of Care Patient      Patient will benefit from skilled therapeutic intervention in order to improve the following  deficits and impairments:  Decreased coordination, Decreased safety awareness, Decreased strength, Decreased mobility, Decreased endurance, Hypomobility, Decreased range of motion, Postural dysfunction  Visit Diagnosis: Other lack of coordination - Plan: PT plan of care cert/re-cert, CANCELED: PT plan of care cert/re-cert  Muscle weakness (generalized) - Plan: PT plan of care cert/re-cert, CANCELED: PT plan of care cert/re-cert     Problem List Patient Active Problem List   Diagnosis  Date Noted  . Prostate cancer ([place]) 02/24/2016  . Urinary urgency 02/24/2016  . Chest pain 01/24/2016  . NSTEMI (non-ST elevated myocardial infarction) ([place]) 01/24/2016  . Elevated PSA 12/30/2015  . Urinary frequency 12/30/2015  . BPH (benign prostatic hyperplasia) 11/27/2015  . Dyslipidemia 11/27/2015  . Essential hypertension 11/27/2015  . Hyperlipidemia 11/27/2015  . Hypertension 11/27/2015  . Snoring 11/27/2015  . Colon adenoma 01/21/2012    Jerl Mina ,PT, DPT, E-RYT  03/22/2016, 6:04 PM  [place] MAIN [facility] SERVICES [address] [place], [place], [address] Phone: [phone]   Fax:  [phone]  Name: Garrett Watkins MRN: [id number] Date of Birth: 06-01-1960

## 2016-03-22 NOTE — Addendum Note (Signed)
Addended by: Jerl Mina on: 03/22/2016 06:08 PM   Modules accepted: Orders

## 2016-04-10 ENCOUNTER — Inpatient Hospital Stay: Admission: RE | Admit: 2016-04-10 | Payer: Self-pay | Source: Ambulatory Visit

## 2016-04-10 ENCOUNTER — Encounter
Admission: RE | Admit: 2016-04-10 | Discharge: 2016-04-10 | Disposition: A | Discharge status: Home / Self Care | Payer: BLUE CROSS/BLUE SHIELD | Source: Ambulatory Visit | Attending: Urology | Admitting: Urology

## 2016-04-10 DIAGNOSIS — Z01812 Encounter for preprocedural laboratory examination: Secondary | ICD-10-CM | POA: Diagnosis not present

## 2016-04-10 HISTORY — DX: Gastro-esophageal reflux disease without esophagitis: K21.9

## 2016-04-10 LAB — URINALYSIS, ROUTINE W REFLEX MICROSCOPIC
BILIRUBIN URINE: NEGATIVE
GLUCOSE, UA: NEGATIVE mg/dL
HGB URINE DIPSTICK: NEGATIVE
Ketones, ur: NEGATIVE mg/dL
Leukocytes, UA: NEGATIVE
Nitrite: NEGATIVE
Protein, ur: NEGATIVE mg/dL
SPECIFIC GRAVITY, URINE: 1.029 (ref 1.005–1.030)
pH: 6 (ref 5.0–8.0)

## 2016-04-10 LAB — CBC
HCT: 39.1 % — ABNORMAL LOW (ref 40.0–52.0)
Hemoglobin: 12.9 g/dL — ABNORMAL LOW (ref 13.0–18.0)
MCH: 29.5 pg (ref 26.0–34.0)
MCHC: 33 g/dL (ref 32.0–36.0)
MCV: 89.4 fL (ref 80.0–100.0)
PLATELETS: 179 10*3/uL (ref 150–440)
RBC: 4.37 MIL/uL — AB (ref 4.40–5.90)
RDW: 13.4 % (ref 11.5–14.5)
WBC: 4.1 10*3/uL (ref 3.8–10.6)

## 2016-04-10 LAB — TYPE AND SCREEN
ABO/RH(D): O POS
ANTIBODY SCREEN: NEGATIVE

## 2016-04-10 LAB — BASIC METABOLIC PANEL
ANION GAP: 5 (ref 5–15)
BUN: 17 mg/dL (ref 6–20)
CALCIUM: 9.1 mg/dL (ref 8.9–10.3)
CO2: 31 mmol/L (ref 22–32)
CREATININE: 1.01 mg/dL (ref 0.61–1.24)
Chloride: 105 mmol/L (ref 101–111)
Glucose, Bld: 79 mg/dL (ref 65–99)
Potassium: 3.5 mmol/L (ref 3.5–5.1)
Sodium: 141 mmol/L (ref 135–145)

## 2016-04-10 NOTE — Pre-Procedure Instructions (Signed)
EKG and cardiac clearance are in front of pt's chart.

## 2016-04-10 NOTE — Patient Instructions (Signed)
  Your procedure is scheduled WE:[id number] April 10 , 2018. Report to Same Day Surgery. To find out your arrival time please call [phone] between 1PM - 3PM on Monday April 20, 2016.  Remember: Instructions that are not followed completely may result in serious medical risk, up to and including death, or upon the discretion of your surgeon and anesthesiologist your surgery may need to be rescheduled.    _x___ 1. Do not eat food or drink liquids after midnight. No gum chewing or hard candies.     ____ 2. No Alcohol for 24 hours before or after surgery.   ____ 3. Bring all medications with you on the day of surgery if instructed.    __x__ 4. Notify your doctor if there is any change in your medical condition     (cold, fever, infections).    _____ 5. No smoking 24 hours prior to surgery.     Do not wear jewelry, make-up, hairpins, clips or nail polish.  Do not wear lotions, powders, or perfumes.   Do not shave 48 hours prior to surgery. Men may shave face and neck.  Do not bring valuables to the hospital.    [facility] is not responsible for any belongings or valuables.               Contacts, dentures or bridgework may not be worn into surgery.  Leave your suitcase in the car. After surgery it may be brought to your room.  For patients admitted to the hospital, discharge time is determined by your treatment team.   Patients discharged the day of surgery will not be allowed to drive home.    Please read over the following fact sheets that you were given:   [facility] Preparing for Surgery  _x___ Take these medicines the morning of surgery with A SIP OF WATER:    1. carvedilol (COREG)   2. telmisartan (MICARDIS)   ____ Fleet Enema (as directed)   _x___ Use CHG Soap as directed on instruction sheet  ____ Use inhalers on the day of surgery and bring to hospital day of surgery  ____ Stop metformin 2 days prior to surgery    ____ Take 1/2 of usual insulin dose the night  before surgery and none on the morning of surgery.   ____ Stop Coumadin/Plavix/aspirin on does not apply.  _x___ Stop Anti-inflammatories such as Advil, Aleve, Ibuprofen, Motrin, Naproxen, Naprosyn, Goodies powders or aspirin products. OK to take Tylenol.   _x___ Stop supplements:TONALIN SAFFLOWER OIL CLA,  TURMERIC  until after surgery.    _x___ Bring C-Pap to the hospital.

## 2016-04-11 LAB — URINE CULTURE: Culture: NO GROWTH

## 2016-04-20 MED ORDER — CEFAZOLIN SODIUM-DEXTROSE 2-4 GM/100ML-% IV SOLN: 2.0000 g | INTRAVENOUS | Status: DC

## 2016-04-20 MED ORDER — FAMOTIDINE 20 MG PO TABS
20.0000 mg | ORAL_TABLET | Freq: Once | ORAL | Status: AC
Start: 2016-04-21 — End: 2016-04-21
  Administered 2016-04-21: 20 mg via ORAL

## 2016-04-20 MED ORDER — MAGNESIUM CITRATE PO SOLN
1.0000 | Freq: Once | ORAL | Status: DC
  Filled 2016-04-20: qty 296

## 2016-04-21 ENCOUNTER — Encounter
Admission: RE | Disposition: A | Discharge status: Home / Self Care | Payer: Self-pay | Source: Ambulatory Visit | Attending: Urology

## 2016-04-21 ENCOUNTER — Observation Stay
Admission: RE | Admit: 2016-04-21 | Discharge: 2016-04-23 | Disposition: A | Discharge status: Home / Self Care | Payer: BLUE CROSS/BLUE SHIELD | Source: Ambulatory Visit | Attending: Urology | Admitting: Urology

## 2016-04-21 ENCOUNTER — Inpatient Hospital Stay: Payer: BLUE CROSS/BLUE SHIELD | Admitting: Anesthesiology

## 2016-04-21 ENCOUNTER — Ambulatory Visit: Payer: BLUE CROSS/BLUE SHIELD

## 2016-04-21 ENCOUNTER — Encounter: Payer: Self-pay | Admitting: *Deleted

## 2016-04-21 DIAGNOSIS — R3915 Urgency of urination: Secondary | ICD-10-CM | POA: Insufficient documentation

## 2016-04-21 DIAGNOSIS — Z888 Allergy status to other drugs, medicaments and biological substances status: Secondary | ICD-10-CM | POA: Diagnosis not present

## 2016-04-21 DIAGNOSIS — I10 Essential (primary) hypertension: Secondary | ICD-10-CM | POA: Insufficient documentation

## 2016-04-21 DIAGNOSIS — Z86010 Personal history of colonic polyps: Secondary | ICD-10-CM | POA: Diagnosis not present

## 2016-04-21 DIAGNOSIS — R0683 Snoring: Secondary | ICD-10-CM | POA: Insufficient documentation

## 2016-04-21 DIAGNOSIS — K219 Gastro-esophageal reflux disease without esophagitis: Secondary | ICD-10-CM | POA: Diagnosis not present

## 2016-04-21 DIAGNOSIS — C61 Malignant neoplasm of prostate: Principal | ICD-10-CM | POA: Insufficient documentation

## 2016-04-21 DIAGNOSIS — Z79899 Other long term (current) drug therapy: Secondary | ICD-10-CM | POA: Diagnosis not present

## 2016-04-21 DIAGNOSIS — E785 Hyperlipidemia, unspecified: Secondary | ICD-10-CM | POA: Diagnosis not present

## 2016-04-21 DIAGNOSIS — N401 Enlarged prostate with lower urinary tract symptoms: Secondary | ICD-10-CM | POA: Insufficient documentation

## 2016-04-21 DIAGNOSIS — I252 Old myocardial infarction: Secondary | ICD-10-CM | POA: Diagnosis not present

## 2016-04-21 DIAGNOSIS — Z9852 Vasectomy status: Secondary | ICD-10-CM | POA: Insufficient documentation

## 2016-04-21 DIAGNOSIS — R079 Chest pain, unspecified: Secondary | ICD-10-CM | POA: Insufficient documentation

## 2016-04-21 DIAGNOSIS — G4733 Obstructive sleep apnea (adult) (pediatric): Secondary | ICD-10-CM | POA: Diagnosis not present

## 2016-04-21 HISTORY — PX: LYMPHADENECTOMY: SHX5960

## 2016-04-21 HISTORY — PX: ROBOT ASSISTED LAPAROSCOPIC RADICAL PROSTATECTOMY: SHX5141

## 2016-04-21 LAB — POCT I-STAT 4, (NA,K, GLUC, HGB,HCT)
Glucose, Bld: 81 mg/dL (ref 65–99)
HEMATOCRIT: 39 % (ref 39.0–52.0)
HEMOGLOBIN: 13.3 g/dL (ref 13.0–17.0)
POTASSIUM: 3.4 mmol/L — AB (ref 3.5–5.1)
Sodium: 142 mmol/L (ref 135–145)

## 2016-04-21 LAB — HEMOGLOBIN AND HEMATOCRIT, BLOOD
HEMATOCRIT: 40.5 % (ref 40.0–52.0)
HEMOGLOBIN: 13.4 g/dL (ref 13.0–18.0)

## 2016-04-21 LAB — ABO/RH: ABO/RH(D): O POS

## 2016-04-21 SURGERY — ROBOTIC ASSISTED LAPAROSCOPIC RADICAL PROSTATECTOMY
Anesthesia: General | Site: Prostate | Wound class: Clean Contaminated

## 2016-04-21 MED ORDER — IRBESARTAN 150 MG PO TABS
300.0000 mg | ORAL_TABLET | Freq: Every day | ORAL | Status: DC
  Administered 2016-04-22 – 2016-04-23 (×2): 300 mg via ORAL
  Filled 2016-04-21: qty 2
  Filled 2016-04-21: qty 1

## 2016-04-21 MED ORDER — BUPIVACAINE HCL (PF) 0.5 % IJ SOLN
INTRAMUSCULAR | Status: AC
  Filled 2016-04-21: qty 30

## 2016-04-21 MED ORDER — DEXAMETHASONE SODIUM PHOSPHATE 10 MG/ML IJ SOLN
INTRAMUSCULAR | Status: AC
  Filled 2016-04-21: qty 1

## 2016-04-21 MED ORDER — FENTANYL CITRATE (PF) 250 MCG/5ML IJ SOLN
INTRAMUSCULAR | Status: AC
  Filled 2016-04-21: qty 5

## 2016-04-21 MED ORDER — SENNOSIDES-DOCUSATE SODIUM 8.6-50 MG PO TABS
1.0000 | ORAL_TABLET | Freq: Two times a day (BID) | ORAL | Status: DC
  Administered 2016-04-21 – 2016-04-23 (×4): 1 via ORAL
  Filled 2016-04-21 (×4): qty 1

## 2016-04-21 MED ORDER — SUGAMMADEX SODIUM 200 MG/2ML IV SOLN
INTRAVENOUS | Status: DC | PRN
  Administered 2016-04-21: 208.6 mg via INTRAVENOUS

## 2016-04-21 MED ORDER — EPHEDRINE SULFATE 50 MG/ML IJ SOLN
INTRAMUSCULAR | Status: AC
  Filled 2016-04-21: qty 1

## 2016-04-21 MED ORDER — FENTANYL CITRATE (PF) 100 MCG/2ML IJ SOLN
INTRAMUSCULAR | Status: AC
  Administered 2016-04-21: 25 ug via INTRAVENOUS
  Filled 2016-04-21: qty 2

## 2016-04-21 MED ORDER — FENTANYL CITRATE (PF) 100 MCG/2ML IJ SOLN
25.0000 ug | INTRAMUSCULAR | Status: DC | PRN
  Administered 2016-04-21 (×5): 25 ug via INTRAVENOUS

## 2016-04-21 MED ORDER — LACTATED RINGERS IV SOLN
INTRAVENOUS | Status: DC
  Administered 2016-04-21 (×2): via INTRAVENOUS

## 2016-04-21 MED ORDER — EPHEDRINE SULFATE 50 MG/ML IJ SOLN
INTRAMUSCULAR | Status: DC | PRN
  Administered 2016-04-21 (×3): 10 mg via INTRAVENOUS

## 2016-04-21 MED ORDER — CEPHALEXIN 500 MG PO CAPS: 500.0000 mg | ORAL_CAPSULE | Freq: Two times a day (BID) | ORAL | 0 refills | Status: DC

## 2016-04-21 MED ORDER — SENNOSIDES-DOCUSATE SODIUM 8.6-50 MG PO TABS: 1.0000 | ORAL_TABLET | Freq: Two times a day (BID) | ORAL | 0 refills | Status: DC

## 2016-04-21 MED ORDER — HYDROCHLOROTHIAZIDE 25 MG PO TABS
25.0000 mg | ORAL_TABLET | Freq: Every day | ORAL | Status: DC
  Administered 2016-04-22 – 2016-04-23 (×2): 25 mg via ORAL
  Filled 2016-04-21 (×2): qty 1

## 2016-04-21 MED ORDER — GLYCOPYRROLATE 0.2 MG/ML IJ SOLN
INTRAMUSCULAR | Status: AC
  Filled 2016-04-21: qty 2

## 2016-04-21 MED ORDER — ONDANSETRON HCL 4 MG/2ML IJ SOLN
INTRAMUSCULAR | Status: AC
  Filled 2016-04-21: qty 2

## 2016-04-21 MED ORDER — PHENOL 1.4 % MT LIQD
1.0000 | OROMUCOSAL | Status: DC | PRN
  Administered 2016-04-22 (×2): 1 via OROMUCOSAL
  Filled 2016-04-21: qty 177

## 2016-04-21 MED ORDER — DEXMEDETOMIDINE HCL 200 MCG/2ML IV SOLN
INTRAVENOUS | Status: DC | PRN
  Administered 2016-04-21: 4 ug via INTRAVENOUS
  Administered 2016-04-21: 8 ug via INTRAVENOUS

## 2016-04-21 MED ORDER — HYDROCODONE-ACETAMINOPHEN 5-325 MG PO TABS: 1.0000 | ORAL_TABLET | Freq: Four times a day (QID) | ORAL | 0 refills | Status: DC | PRN

## 2016-04-21 MED ORDER — CEFAZOLIN SODIUM-DEXTROSE 2-4 GM/100ML-% IV SOLN
INTRAVENOUS | Status: AC
  Filled 2016-04-21: qty 100

## 2016-04-21 MED ORDER — FENTANYL CITRATE (PF) 100 MCG/2ML IJ SOLN
INTRAMUSCULAR | Status: AC
  Filled 2016-04-21: qty 2

## 2016-04-21 MED ORDER — SEVOFLURANE IN SOLN
RESPIRATORY_TRACT | Status: AC
  Filled 2016-04-21: qty 250

## 2016-04-21 MED ORDER — KCL IN DEXTROSE-NACL 20-5-0.45 MEQ/L-%-% IV SOLN
INTRAVENOUS | Status: DC
  Administered 2016-04-21 – 2016-04-22 (×2): via INTRAVENOUS
  Filled 2016-04-21 (×5): qty 1000

## 2016-04-21 MED ORDER — ACETAMINOPHEN 500 MG PO TABS
1000.0000 mg | ORAL_TABLET | Freq: Three times a day (TID) | ORAL | Status: AC
  Administered 2016-04-22: 1000 mg via ORAL
  Filled 2016-04-21 (×2): qty 2

## 2016-04-21 MED ORDER — MIDAZOLAM HCL 2 MG/2ML IJ SOLN
INTRAMUSCULAR | Status: DC | PRN
  Administered 2016-04-21: 2 mg via INTRAVENOUS

## 2016-04-21 MED ORDER — SODIUM CHLORIDE FLUSH 0.9 % IV SOLN
INTRAVENOUS | Status: AC
  Filled 2016-04-21: qty 10

## 2016-04-21 MED ORDER — FENTANYL CITRATE (PF) 100 MCG/2ML IJ SOLN
INTRAMUSCULAR | Status: DC | PRN
  Administered 2016-04-21 (×2): 50 ug via INTRAVENOUS
  Administered 2016-04-21: 150 ug via INTRAVENOUS
  Administered 2016-04-21: 100 ug via INTRAVENOUS

## 2016-04-21 MED ORDER — LIDOCAINE 2% (20 MG/ML) 5 ML SYRINGE
INTRAMUSCULAR | Status: DC | PRN
  Administered 2016-04-21: 100 mg via INTRAVENOUS

## 2016-04-21 MED ORDER — SUGAMMADEX SODIUM 200 MG/2ML IV SOLN
INTRAVENOUS | Status: AC
  Filled 2016-04-21: qty 2

## 2016-04-21 MED ORDER — SUCCINYLCHOLINE CHLORIDE 20 MG/ML IJ SOLN
INTRAMUSCULAR | Status: DC | PRN
  Administered 2016-04-21: 100 mg via INTRAVENOUS

## 2016-04-21 MED ORDER — THROMBIN 5000 UNITS EX SOLR
CUTANEOUS | Status: DC | PRN
  Administered 2016-04-21: 5000 [IU] via TOPICAL

## 2016-04-21 MED ORDER — ROCURONIUM BROMIDE 100 MG/10ML IV SOLN
INTRAVENOUS | Status: DC | PRN
  Administered 2016-04-21 (×3): 10 mg via INTRAVENOUS
  Administered 2016-04-21: 40 mg via INTRAVENOUS
  Administered 2016-04-21: 10 mg via INTRAVENOUS

## 2016-04-21 MED ORDER — ONDANSETRON HCL 4 MG/2ML IJ SOLN
INTRAMUSCULAR | Status: DC | PRN
  Administered 2016-04-21: 4 mg via INTRAVENOUS

## 2016-04-21 MED ORDER — GLYCOPYRROLATE 0.2 MG/ML IJ SOLN
INTRAMUSCULAR | Status: DC | PRN
  Administered 2016-04-21 (×2): 0.2 mg via INTRAVENOUS

## 2016-04-21 MED ORDER — ONDANSETRON HCL 4 MG/2ML IJ SOLN: 4.0000 mg | Freq: Once | INTRAMUSCULAR | Status: DC | PRN

## 2016-04-21 MED ORDER — FENTANYL CITRATE (PF) 100 MCG/2ML IJ SOLN
INTRAMUSCULAR | Status: AC
Start: 2016-04-21 — End: 2016-04-22
  Filled 2016-04-21: qty 2

## 2016-04-21 MED ORDER — SUCCINYLCHOLINE CHLORIDE 20 MG/ML IJ SOLN
INTRAMUSCULAR | Status: AC
  Filled 2016-04-21: qty 1

## 2016-04-21 MED ORDER — MIDAZOLAM HCL 2 MG/2ML IJ SOLN
INTRAMUSCULAR | Status: AC
  Filled 2016-04-21: qty 2

## 2016-04-21 MED ORDER — BUPIVACAINE HCL 0.5 % IJ SOLN
INTRAMUSCULAR | Status: DC | PRN
  Administered 2016-04-21: 30 mL

## 2016-04-21 MED ORDER — PROPOFOL 10 MG/ML IV BOLUS
INTRAVENOUS | Status: DC | PRN
  Administered 2016-04-21: 200 mg via INTRAVENOUS

## 2016-04-21 MED ORDER — OXYCODONE HCL 5 MG PO TABS
5.0000 mg | ORAL_TABLET | ORAL | Status: DC | PRN
Start: 2016-04-21 — End: 2016-04-22
  Administered 2016-04-21 – 2016-04-22 (×4): 5 mg via ORAL
  Filled 2016-04-21 (×4): qty 1

## 2016-04-21 MED ORDER — FAMOTIDINE 20 MG PO TABS
ORAL_TABLET | ORAL | Status: AC
  Administered 2016-04-21: 20 mg via ORAL
  Filled 2016-04-21: qty 1

## 2016-04-21 MED ORDER — CARVEDILOL 12.5 MG PO TABS
12.5000 mg | ORAL_TABLET | Freq: Two times a day (BID) | ORAL | Status: DC
  Administered 2016-04-21 – 2016-04-23 (×3): 12.5 mg via ORAL
  Filled 2016-04-21 (×3): qty 1

## 2016-04-21 MED ORDER — ARTIFICIAL TEARS OP OINT
TOPICAL_OINTMENT | OPHTHALMIC | Status: AC
  Filled 2016-04-21: qty 3.5

## 2016-04-21 MED ORDER — SODIUM CHLORIDE 0.9 % IV SOLN
INTRAVENOUS | Status: DC | PRN
  Administered 2016-04-21: 08:00:00 via INTRAVENOUS

## 2016-04-21 MED ORDER — DEXAMETHASONE SODIUM PHOSPHATE 10 MG/ML IJ SOLN
INTRAMUSCULAR | Status: DC | PRN
  Administered 2016-04-21: 10 mg via INTRAVENOUS

## 2016-04-21 MED ORDER — PROPOFOL 10 MG/ML IV BOLUS
INTRAVENOUS | Status: AC
  Filled 2016-04-21: qty 20

## 2016-04-21 MED ORDER — THROMBIN 5000 UNITS EX SOLR
CUTANEOUS | Status: AC
  Filled 2016-04-21: qty 5000

## 2016-04-21 MED ORDER — HYDROMORPHONE HCL 1 MG/ML IJ SOLN
0.5000 mg | INTRAMUSCULAR | Status: DC | PRN
  Administered 2016-04-21: 0.5 mg via INTRAVENOUS
  Administered 2016-04-22: 1 mg via INTRAVENOUS
  Filled 2016-04-21 (×2): qty 1

## 2016-04-21 MED ORDER — SODIUM CHLORIDE 0.9 % IV BOLUS (SEPSIS): 1000.0000 mL | Freq: Once | INTRAVENOUS | Status: DC

## 2016-04-21 SURGICAL SUPPLY — 98 items
ANCHOR TIS RET SYS 235ML (MISCELLANEOUS) ×6 IMPLANT
APPLICATOR SURGIFLO (MISCELLANEOUS) ×3 IMPLANT
APPLIER CLIP LOGIC TI 5 (MISCELLANEOUS) ×3 IMPLANT
BAG URO DRAIN 2000ML W/SPOUT (MISCELLANEOUS) ×3 IMPLANT
BLADE CLIPPER SURG (BLADE) ×3 IMPLANT
BULB RESERV EVAC DRAIN JP 100C (MISCELLANEOUS) IMPLANT
CANISTER SUCT 1200ML W/VALVE (MISCELLANEOUS) ×3 IMPLANT
CATH DRAINAGE MALECOT 26FR (CATHETERS) IMPLANT
CATH FOL 2WAY LX 18X5 (CATHETERS) ×6 IMPLANT
CATH MALECOT (CATHETERS)
CHLORAPREP W/TINT 26ML (MISCELLANEOUS) ×6 IMPLANT
CLIP LIGATING HEM O LOK PURPLE (MISCELLANEOUS) ×18 IMPLANT
CLIP SUT LAPRA TY ABSORB (SUTURE) IMPLANT
CORD BIP STRL DISP 12FT (MISCELLANEOUS) ×3 IMPLANT
CORD MONOPOLAR M/FML 12FT (MISCELLANEOUS) ×3 IMPLANT
COVER TIP SHEARS 8 DVNC (MISCELLANEOUS) IMPLANT
COVER TIP SHEARS 8MM DA VINCI (MISCELLANEOUS)
CUTTER ECHEON FLEX ENDO 45 340 (ENDOMECHANICALS) ×3 IMPLANT
DEFOGGER SCOPE WARMER CLEARIFY (MISCELLANEOUS) ×3 IMPLANT
DERMABOND ADVANCED (GAUZE/BANDAGES/DRESSINGS) ×2
DERMABOND ADVANCED .7 DNX12 (GAUZE/BANDAGES/DRESSINGS) ×4 IMPLANT
DRAIN CHANNEL JP 15F RND 16 (MISCELLANEOUS) IMPLANT
DRAIN CHANNEL JP 19F (MISCELLANEOUS) IMPLANT
DRAPE LEGGINS SURG 28X43 STRL (DRAPES) ×3 IMPLANT
DRAPE SHEET LG 3/4 BI-LAMINATE (DRAPES) ×6 IMPLANT
DRAPE SURG 17X11 SM STRL (DRAPES) IMPLANT
DRAPE TABLE BACK 80X90 (DRAPES) ×3 IMPLANT
DRAPE UNDER BUTTOCK W/FLU (DRAPES) ×3 IMPLANT
DRIVER LRG NEEDLE DA VINCI (INSTRUMENTS) ×2
DRIVER NDLE LRG DVNC (INSTRUMENTS) ×4 IMPLANT
DRSG TELFA 3X8 NADH (GAUZE/BANDAGES/DRESSINGS) ×6 IMPLANT
ELECT REM PT RETURN 9FT ADLT (ELECTROSURGICAL) ×3
ELECTRODE REM PT RTRN 9FT ADLT (ELECTROSURGICAL) ×2 IMPLANT
FILTER LAP SMOKE EVAC STRL (MISCELLANEOUS) ×3 IMPLANT
FORCEPS MARYLAND BIPOLAR 8X55 (INSTRUMENTS) ×1
FORCEPS MRYLND BPLR 8X55 DVNC (INSTRUMENTS) ×2 IMPLANT
GLOVE BIO SURGEON STRL SZ 6.5 (GLOVE) ×9 IMPLANT
GOWN STRL REUS W/ TWL LRG LVL3 (GOWN DISPOSABLE) ×6 IMPLANT
GOWN STRL REUS W/TWL LRG LVL3 (GOWN DISPOSABLE) ×3
GRASPER SUT TROCAR 14GX15 (MISCELLANEOUS) ×3 IMPLANT
HEMOSTAT SURGICEL 2X14 (HEMOSTASIS) ×3 IMPLANT
HOLDER FOLEY CATH W/STRAP (MISCELLANEOUS) ×3 IMPLANT
IRRIGATION STRYKERFLOW (MISCELLANEOUS) ×2 IMPLANT
IRRIGATOR STRYKERFLOW (MISCELLANEOUS) ×3
IV LACTATED RINGERS 1000ML (IV SOLUTION) IMPLANT
IV NS 1000ML (IV SOLUTION) ×1
IV NS 1000ML BAXH (IV SOLUTION) ×2 IMPLANT
KIT ACCESSORY DA VINCI DISP (KITS) ×1
KIT ACCESSORY DVNC DISP (KITS) ×2 IMPLANT
KIT PINK PAD W/HEAD ARE REST (MISCELLANEOUS) ×3
KIT PINK PAD W/HEAD ARM REST (MISCELLANEOUS) ×2 IMPLANT
LABEL OR SOLS (LABEL) ×3 IMPLANT
MARKER SKIN DUAL TIP RULER LAB (MISCELLANEOUS) ×3 IMPLANT
NDL SAFETY 18GX1.5 (NEEDLE) IMPLANT
NEEDLE HYPO 25X1 1.5 SAFETY (NEEDLE) ×3 IMPLANT
NEEDLE INSUFFLATION 14GA 120MM (NEEDLE) ×3 IMPLANT
NS IRRIG 500ML POUR BTL (IV SOLUTION) ×3 IMPLANT
PACK LAP CHOLECYSTECTOMY (MISCELLANEOUS) ×3 IMPLANT
PENCIL ELECTRO HAND CTR (MISCELLANEOUS) ×3 IMPLANT
PROGRASP ENDOWRIST DA VINCI (INSTRUMENTS) ×1
PROGRASP ENDOWRIST DVNC (INSTRUMENTS) ×2 IMPLANT
RELOAD GRAY ECHELON (STAPLE) IMPLANT
RELOAD GREEN ECHELON 45 (STAPLE) ×6 IMPLANT
SCISSORS METZENBAUM CVD 33 (INSTRUMENTS) IMPLANT
SLEEVE ENDOPATH XCEL 5M (ENDOMECHANICALS) ×6 IMPLANT
SOLUTION ELECTROLUBE (MISCELLANEOUS) ×3 IMPLANT
SPOGE SURGIFLO 8M (HEMOSTASIS) ×1
SPONGE LAP 18X18 5 PK (GAUZE/BANDAGES/DRESSINGS) ×3 IMPLANT
SPONGE SURGIFLO 8M (HEMOSTASIS) ×2 IMPLANT
SPONGE VERSALON 4X4 4PLY (MISCELLANEOUS) ×3 IMPLANT
STAPLER SKIN PROX 35W (STAPLE) ×3 IMPLANT
STRAP SAFETY BODY (MISCELLANEOUS) ×9 IMPLANT
SUT DVC VLOC 90 3-0 CV23 UNDY (SUTURE) ×6 IMPLANT
SUT DVC VLOC 90 3-0 CV23 VLT (SUTURE) ×3
SUT ETHILON 3-0 FS-10 30 BLK (SUTURE)
SUT MNCRL 4-0 (SUTURE) ×2
SUT MNCRL 4-0 27XMFL (SUTURE) ×4
SUT PROLENE 5 0 PS 3 (SUTURE) IMPLANT
SUT SILK 2 0 SH (SUTURE) ×3 IMPLANT
SUT VIC AB 0 CT1 36 (SUTURE) ×3 IMPLANT
SUT VIC AB 2-0 CT1 (SUTURE) ×3 IMPLANT
SUT VIC AB 2-0 SH 27 (SUTURE) ×1
SUT VIC AB 2-0 SH 27XBRD (SUTURE) ×2 IMPLANT
SUT VICRYL 0 AB UR-6 (SUTURE) ×3 IMPLANT
SUTURE DVC VLC 90 3-0 CV23 VLT (SUTURE) ×2 IMPLANT
SUTURE EHLN 3-0 FS-10 30 BLK (SUTURE) IMPLANT
SUTURE MNCRL 4-0 27XMF (SUTURE) ×4 IMPLANT
SYR BULB IRRIG 60ML STRL (SYRINGE) ×3 IMPLANT
SYRINGE 10CC LL (SYRINGE) ×3 IMPLANT
SYRINGE IRR TOOMEY STRL 70CC (SYRINGE) IMPLANT
TAPE CLOTH 10X20 WHT NS LF (TAPE) ×2 IMPLANT
TAPE CLOTH 2X10 WHT NS LF (TAPE) ×1
TROCAR DISP BLADELESS 8 DVNC (TROCAR) ×2 IMPLANT
TROCAR DISP BLADELESS 8MM (TROCAR) ×1
TROCAR ENDOPATH XCEL 12X100 BL (ENDOMECHANICALS) ×6 IMPLANT
TROCAR XCEL 12X100 BLDLESS (ENDOMECHANICALS) ×3 IMPLANT
TROCAR XCEL NON-BLD 5MMX100MML (ENDOMECHANICALS) ×3 IMPLANT
TUBING INSUFFLATOR HI FLOW (MISCELLANEOUS) ×3 IMPLANT

## 2016-04-21 NOTE — Anesthesia Post-op Follow-up Note (Cosign Needed)
Anesthesia QCDR form completed.        

## 2016-04-21 NOTE — Anesthesia Postprocedure Evaluation (Signed)
Anesthesia Post Note  Patient: Garrett Watkins  Procedure(s) Performed: Procedure(s) (LRB): ROBOTIC ASSISTED LAPAROSCOPIC RADICAL PROSTATECTOMY (N/A) PELVIC LYMPHADENECTOMY (Bilateral)  Patient location during evaluation: PACU Anesthesia Type: General Level of consciousness: awake and alert Pain management: pain level controlled Vital Signs Assessment: post-procedure vital signs reviewed and stable Respiratory status: spontaneous breathing, nonlabored ventilation, respiratory function stable and patient connected to nasal cannula oxygen Cardiovascular status: blood pressure returned to baseline and stable Postop Assessment: no signs of nausea or vomiting Anesthetic complications: no     Last Vitals:  Vitals:   04/21/16 1241 04/21/16 1256  BP: 137/87 138/87  Pulse: (!) 52 (!) 52  Resp: 18 17  Temp:      Last Pain:  Vitals:   04/21/16 1256  TempSrc:   PainSc: [name]

## 2016-04-21 NOTE — Interval H&P Note (Signed)
History and Physical Interval Note:  04/21/2016 7:10 AM  Garrett Watkins  has presented today for surgery, with the diagnosis of PROSTATE CANCER  The various methods of treatment have been discussed with the patient and family. After consideration of risks, benefits and other options for treatment, the patient has consented to  Procedure(s): ROBOTIC ASSISTED LAPAROSCOPIC RADICAL PROSTATECTOMY (N/A) PELVIC LYMPHADENECTOMY (Bilateral) as a surgical intervention .  The patient's history has been reviewed, patient examined, no change in status, stable for surgery.  I have reviewed the patient's chart and labs.  Questions were answered to the patient's satisfaction.     Radford Pease

## 2016-04-21 NOTE — Anesthesia Procedure Notes (Signed)
Procedure Name: Intubation Date/Time: 04/21/2016 7:45 AM Performed by: Marsh Dolly Pre-anesthesia Checklist: Patient identified, Patient being monitored, Timeout performed, Emergency Drugs available and Suction available Patient Re-evaluated:Patient Re-evaluated prior to inductionOxygen Delivery Method: Circle system utilized Preoxygenation: Pre-oxygenation with 100% oxygen Intubation Type: IV induction Ventilation: Mask ventilation without difficulty Laryngoscope Size: 3 and Miller Grade View: Grade I Tube type: Oral Tube size: 7.5 mm Number of attempts: 1 Placement Confirmation: ETT inserted through vocal cords under direct vision,  positive ETCO2 and breath sounds checked- equal and bilateral Secured at: 21 cm Tube secured with: Tape Dental Injury: Teeth and Oropharynx as per pre-operative assessment

## 2016-04-21 NOTE — Anesthesia Preprocedure Evaluation (Signed)
Anesthesia Evaluation  Patient identified by MRN, date of birth, ID band Patient awake    Reviewed: Allergy & Precautions, H&P , NPO status , Patient's Chart, lab work & pertinent test results, reviewed documented beta blocker date and time   Airway Mallampati: II  TM Distance: >3 FB Neck ROM: full    Dental  (+) Teeth Intact   Pulmonary neg pulmonary ROS, sleep apnea and Continuous Positive Airway Pressure Ventilation ,    Pulmonary exam normal        Cardiovascular hypertension, + Past MI  negative cardio ROS Normal cardiovascular exam Rhythm:regular Rate:Normal     Neuro/Psych negative neurological ROS  negative psych ROS   GI/Hepatic negative GI ROS, Neg liver ROS, GERD  Medicated,  Endo/Other  negative endocrine ROS  Renal/GU negative Renal ROS  negative genitourinary   Musculoskeletal   Abdominal   Peds  Hematology negative hematology ROS (+)   Anesthesia Other Findings Past Medical History: No date: BPH (benign prostatic hyperplasia) No date: Colon adenoma No date: Dyslipidemia No date: GERD (gastroesophageal reflux disease)     Comment: history of No date: Hypertension No date: OSA (obstructive sleep apnea) No date: Prostate cancer [facility]) Past Surgical History: 01/24/2016: CARDIAC CATHETERIZATION Right     Comment: Procedure: Left Heart Cath and Coronary               Angiography;  Surgeon: Corey Skains, MD;                Location: [place] [facility];  Service:               Cardiovascular;  Laterality: Right; No date: CYST EXCISION No date: NO PAST SURGERIES No date: VASECTOMY   Reproductive/Obstetrics negative OB ROS                             Anesthesia Physical Anesthesia Plan  ASA: II  Anesthesia Plan: General ETT   Post-op Pain Management:    Induction:   Airway Management Planned:   Additional Equipment:   Intra-op Plan:   Post-operative Plan:    Informed Consent: I have reviewed the patients History and Physical, chart, labs and discussed the procedure including the risks, benefits and alternatives for the proposed anesthesia with the patient or authorized representative who has indicated his/her understanding and acceptance.   Dental Advisory Given  Plan Discussed with: CRNA  Anesthesia Plan Comments:         Anesthesia Quick Evaluation

## 2016-04-21 NOTE — Op Note (Signed)
NAME:  Garrett Watkins, Garrett Watkins                      ACCOUNT NO.:  MEDICAL RECORD NO.:  [id number]  LOCATION:                                 FACILITY:  PHYSICIAN:  Alexis Frock, MD     DATE OF BIRTH:  06/15/1960  DATE OF PROCEDURE: 04/21/2016                               OPERATIVE REPORT   PREOPERATIVE DIAGNOSIS:  Moderate risk prostate cancer.  PROCEDURE PERFORMED: 1. Robotic-assisted laparoscopic radical prostatectomy. 2. Bilateral pelvic lymphadenectomy.  SURGEON:  Alexis Frock, MD  ESTIMATED BLOOD LOSS:  150 mL.  COMPLICATION:  None.  SPECIMEN: 1. Right external iliac lymph nodes. 2. Right obturator lymph nodes. 3. Left external iliac lymph nodes. 4. Left obturator lymph nodes. 5. Radical prostatectomy. 6. Periprostatic fat. All for permanent pathology.  ASSISTANT:  Hollice Espy, MD.  FINDINGS:  Significant desmoplastic reaction posterior plane, otherwise unremarkable pelvis and prostate and iliac lymph node fields.  INDICATION:  Garrett Watkins is a very pleasant, 56 year old man, who was found on workup with elevated and rising PSA to have multifocal right- sided adenocarcinoma of the prostate, moderate risk.  His excellent functional status options were discussed for management including surveillance protocols versus ablative therapies versus surgical extirpation, and he wished to proceed with robotic prostatectomy. Informed consent was obtained and placed in the medical record.  DESCRIPTION OF PROCEDURE:  The patient being, Garrett Watkins, was verified. Procedure being robotic prostatectomy was confirmed.  Procedure was carried out.  Time-out was performed.  Intravenous antibiotics were administered.  General anesthesia introduced.  Arms were tucked to the side, padding bony prominences.  Low lithotomy position.  Padding of his lateral knees was performed.  He was further fashioned to the operating table using 3-inch tape over foam padding across his supraxiphoid  chest. A test of steep Trendelenburg positioning was performed, he was found to be suitably positioned.  Sterile field was created by prepping and draping the patient's entire infra-xiphoid abdomen using chlorhexidine gluconate and his penis, perineum, proximal thighs using iodine and a high-flow, low pressure pneumoperitoneum was obtained using Veress technique in the supraumbilical midline having passed the aspiration and drop test.  Next, a 12 mm port was placed in this location. Laparoscopic examination of the peritoneal cavity revealed no significant adhesions and no visceral injury.  Additional ports were placed as follows right paramedian 8-mm robotic port, right far lateral 12-mm assist port, right paramedian 5-mm suction port, left paramedian 8- mm robotic port, left far lateral 8-mm robotic port.  Robot was docked and passed through the electronic checks.  The initial incision was made lateral to the left medial umbilical ligament from the anterior abdominal wall coursing along the iliac vessels towards the area of the internal ring and towards the area of the ureter, which was positively identified.  This allowed sweeping of the bladder wall medially.  Vas deferens was encountered and purposely ligated using medial bucket- handle.  This allowed inspection of the left pelvic lymph node fields. Template lymphadenectomy was performed of the left external iliac lymph nodes within the confines of the left external iliac artery, vein, pelvic side wall, iliac bifurcation.  Lymphostasis was achieved with cold  clips, set aside, labeled left external iliac lymph nodes.  Next, left obturator lymph nodes were carefully dissected confines being left external iliac vein, pelvic sidewall, and obturator nerve.  Lymphostasis achieved with cold clips, set aside, labeled left obturator lymph nodes. Dissection proceeded then down towards the area of the endopelvic fascia, which was carefully swept  away from the lateral aspect of the prostate in a base-to-apex orientation on the left side.  A mirror- imaged dissection was then performed on the right along the right bladder wall was freed medially and allowing access to the right-sided pelvic lymph node fields and the right-sided template lymphadenectomy was performed as well as the right external iliac lymph node and right obturator lymph nodes respectively.  The obturator nerve was inspected following maneuvers bilaterally and found to be intact.  Dissection then proceeded to the endopelvic fascia on the right side down towards the apex of the prostate.  Anterior dissection was performed by developing the space of Retzius towards the area of the dorsal vein and prostate and the dorsal venous complex was carefully identified and controlled using a 45 mm stapler taking exquisite care to avoid membranous urethral injury, which did not occur.  The area of the prostate and bladder neck was defatted to better allow identification of the junction, this was set aside, and labeled as periprostatic fat for permanent pathology. Dissection then proceeded in the anterior-posterior direction, separating the bladder neck from the base of the prostate keeping what appeared to be a rim of circular muscle fibers with each side of the specimen taking exquisite care also to avoid excessive caliber bladder neck, which did not occur.  Posterior dissection was then performed by incising approximately 7 mm inferior-posterior, posterior of the prostate entering the plane of Denonvilliers.  This plane was quite desmoplastic and there were no obvious signs of acute infection, this appeared to be more chronic and likely reactive, it has been months since his prostate biopsy.  Bilateral vas deferens was encountered, dissected for distance approximately 3 cm, ligated, and placed on gentle superior traction.  Bilateral seminal vesicles were dissected to  their tips.  The seminal vesicles were densely adherent to the plane of Denonvilliers and were completely removed but somewhat piecemeal. Dissection then proceeded towards the apex of the prostate from posterior plane staying within the plane of Denonvilliers.  This exposed the neurovascular pedicles bilaterally.  Pedicle dissection was performed on the right side first using a sequential clipping technique from the base to apex performing a purposeful wide dissection on the right given the patient's predominantly right-sided disease.  Similar pedicle dissection was performed on the left side by performing partial nerve-sparing staying much closer to the area of the prostate given the paucity of left-sided lateral tumor.  Final prostatic dissection was performed in the anterior plane, placing the prostate in gentle superior traction.  This allowed cold transection of membranous urethra. Posterior reconstruction was performed with a single 3-0 V-Loc suture reapproximating the posterior urethral plate to posterior bladder neck bringing these structures in the tension-free apposition.  Next, mucosa- to-mucosa anastomosis was performed using double-armed V-Loc suture reapproximating the mucosa of the membranous urethral stump to the bladder neck, which resulted in excellent apposition from the 6 o'clock to 12 o'clock position bilaterally.  A new Foley catheter was then placed, which irrigated quantitatively.  At this point, all sponge and needle counts were correct.  Hemostasis appeared excellent.  10 mL of FloSeal was applied to the area of the deep  pelvis.  Robot was then undocked.  A closed suction drain was brought through the previous left lateral most robotic port site near the peritoneal cavity so that it lied not in direct apposition to the urethral anastomosis and the right- sided 12 mm assistant port site was closed at the level of the fascia using a Carter-Thomason suture passer  and 0 Vicryl.  Specimen was retrieved by extending the previous camera port site inferiorly towards the area of the umbilicus removing the prostatectomy specimen, setting aside for permanent pathology.  This site was closed at the level of the fascia using figure-of-eight PDS x3 followed by reapproximation of Scarpa's with running Vicryl.  All incision sites were infiltrated with Marcaine and closed at the level of the skin using subcuticular Monocryl and Dermabond.  Procedure then terminated.  The patient tolerated the procedure well.  No immediate periprocedural complications.  The patient was taken to the postanesthesia care unit in stable condition.  Please note, first assistant, Dr. Hollice Espy, was actually crucial for all robotic portions of procedure today.  She provided invaluable suctioning, retraction, clip application, suture passage, vascular staple line, without which this would not have been possible.          ______________________________ Alexis Frock, MD     TM/MEDQ  D:  04/21/2016  T:  04/21/2016  Job:  [id number]

## 2016-04-21 NOTE — H&P (Signed)
Garrett Watkins is an 55 y.o. male.    Chief Complaint: Pre-op Prostatectomy  HPI:   1 - Moderate Risk Prostate Cancer - Gleason 3+4=7 and 3+3=6 in up to 53% of RMB, RLM, RLB (3/12) by biopsy 12/2015. TRUS 66mL on eval PA rising to 4.14. Had opinion from Dr. Donella Stade with rad-onc with consideration of brachy.   2 - Lower Urinary Tract Symptoms / Nocturia- pt with modest bother from urgency / freqeuncy with nocturia x 4 now improved on tamsulosin.   PMH sig for NSTEMI (favorable cath 2018, only mild strain with HTN urgency, not limiting whatsoever), OSA.  Today "Garrett Watkins" is seen to proceed with prostatectomy for primary therapy for his prostate cancer.    Past Medical History:  Diagnosis Date  . BPH (benign prostatic hyperplasia)   . Colon adenoma   . Dyslipidemia   . GERD (gastroesophageal reflux disease)    history of  . Hypertension   . OSA (obstructive sleep apnea)   . Prostate cancer [facility])     Past Surgical History:  Procedure Laterality Date  . CARDIAC CATHETERIZATION Right 01/24/2016   Procedure: Left Heart Cath and Coronary Angiography;  Surgeon: Corey Skains, MD;  Location: [facility];  Service: Cardiovascular;  Laterality: Right;  . CYST EXCISION    . NO PAST SURGERIES    . VASECTOMY      Family History  Problem Relation Age of Onset  . CAD Mother   . Prostate cancer Neg Hx    Social History:  reports that he has never smoked. He has never used smokeless tobacco. He reports that he does not drink alcohol or use drugs.  Allergies:  Allergies  Allergen Reactions  . Amlodipine Other (See Comments)    Swelling in ankles (edema)---with higher doses per patient.    Medications Prior to Admission  Medication Sig Dispense Refill  . carvedilol (COREG) 12.5 MG tablet Take 12.5 mg by mouth 2 (two) times daily.  11  . hydrochlorothiazide (HYDRODIURIL) 25 MG tablet Take 25 mg by mouth daily.     . Multiple Vitamin (MULTIVITAMIN WITH MINERALS) TABS tablet  Take 1 tablet by mouth daily.    . Pseudoeph-Doxylamine-DM-APAP (NYQUIL PO) Take 30 mLs by mouth at bedtime as needed (for sleep.).     Marland Kitchen tamsulosin (FLOMAX) 0.4 MG CAPS capsule Take 1 capsule (0.4 mg total) by mouth daily. (Patient taking differently: Take 0.4 mg by mouth every evening. ) 30 capsule 11  . telmisartan (MICARDIS) 80 MG tablet Take 80 mg by mouth daily.     Marland Kitchen TONALIN SAFFLOWER OIL CLA PO Take 800 mg by mouth 2 (two) times daily.    . TURMERIC PO Take 1,200 mg by mouth daily. TURMERIC ROOT EXTRACT    . cloNIDine (CATAPRES) 0.2 MG tablet Take 1 tablet (0.2 mg total) by mouth 2 (two) times daily. (Patient not taking: Reported on 02/04/2016) 60 tablet 1  . losartan (COZAAR) 100 MG tablet Take 1 tablet (100 mg total) by mouth daily. (Patient not taking: Reported on 02/04/2016) 30 tablet 2    Results for orders placed or performed during the hospital encounter of 04/21/16 (from the past 48 hour(s))  I-STAT 4, (NA,K, GLUC, HGB,HCT)     Status: Abnormal   Collection Time: 04/21/16  6:54 AM  Result Value Ref Range   Sodium 142 135 - 145 mmol/L   Potassium 3.4 (L) 3.5 - 5.1 mmol/L   Glucose, Bld 81 65 - 99 mg/dL   HCT  39.0 39.0 - 52.0 %   Hemoglobin 13.3 13.0 - 17.0 g/dL   No results found.  Review of Systems  Constitutional: Negative.  Negative for chills and fever.  HENT: Negative.   Eyes: Negative.   Respiratory: Negative.   Cardiovascular: Negative.   Gastrointestinal: Negative.   Genitourinary: Negative.   Musculoskeletal: Negative.   Skin: Negative.   Neurological: Negative.   Endo/Heme/Allergies: Negative.   Psychiatric/Behavioral: Negative.     Blood pressure (!) 148/94, pulse 61, temperature 97.8 F (36.6 C), temperature source Oral, resp. rate 16, SpO2 100 %. Physical Exam  Constitutional: He is oriented to person, place, and time. He appears well-developed.  HENT:  Head: Normocephalic.  Eyes: Pupils are equal, round, and reactive to light.  Neck: Normal range  of motion.  Respiratory: Effort normal.  GI: Soft.  Genitourinary:  Genitourinary Comments: No CVAT  Neurological: He is alert and oriented to person, place, and time.  Skin: Skin is warm.  Psychiatric: He has a normal mood and affect. His behavior is normal. Thought content normal.     Assessment/Plan Proceed as planned with prostatectomy today. Risks, benefits, alternatives, expected peri-op course, need for temporary post-op tubes / catheters discussed previously and reiterated today.   Alexis Frock, MD 04/21/2016, 7:07 AM

## 2016-04-21 NOTE — Discharge Instructions (Signed)
1- Drain Sites - You may have some mild persistent drainage from old drain site for several days, this is normal. This can be covered with cotton gauze for convenience.  2 - Stiches - Your stitches are all dissolvable. You may notice a "loose thread" at your incisions, these are normal and require no intervention. You may cut them flush to the skin with fingernail clippers if needed for comfort.  3 - Diet - No restrictions  4 - Activity - No heavy lifting / straining (any activities that require valsalva or "bearing down") x 4 weeks. Otherwise, no restrictions.  5 - Bathing - You may shower immediately. Do not take a bath or get into swimming pool where incision sites are submersed in water x 4 weeks.   6 - Catheter - Will remain in place until removed at your next appointment. It may be cleaned with soap and water in the shower. It may be disconnected from the drain bag while in the shower to avoid tripping over the tube. You may apply Neosporin or Vaseline ointment as needed to the tip of the penis where the catheter inserts to reduce friction and irritation in this spot.   7 - When to Call the Doctor - Call MD for any fever >102, any acute wound problems, or any severe nausea / vomiting. You can call the  Urology Office [phone]) 24 hours a day 365 days a year. It will roll-over to the answering service and on-call physician after hours.

## 2016-04-21 NOTE — Transfer of Care (Signed)
Immediate Anesthesia Transfer of Care Note  Patient: Garrett Watkins  Procedure(s) Performed: Procedure(s): ROBOTIC ASSISTED LAPAROSCOPIC RADICAL PROSTATECTOMY (N/A) PELVIC LYMPHADENECTOMY (Bilateral)  Patient Location: PACU  Anesthesia Type:General  Level of Consciousness: awake, alert  and oriented  Airway & Oxygen Therapy: Patient Spontanous Breathing and Patient connected to face mask oxygen  Post-op Assessment: Report given to RN and Post -op Vital signs reviewed and stable  Post vital signs: Reviewed and stable  Last Vitals:  Vitals:   04/21/16 0625  BP: (!) 148/94  Pulse: 61  Resp: 16  Temp: 36.6 C    Last Pain:  Vitals:   04/21/16 0625  TempSrc: Oral         Complications: No apparent anesthesia complications

## 2016-04-21 NOTE — Brief Op Note (Signed)
04/21/2016  11:01 AM  PATIENT:  Garrett Watkins  56 y.o. male  PRE-OPERATIVE DIAGNOSIS:  PROSTATE CANCER  POST-OPERATIVE DIAGNOSIS:  PROSTATE CANCER  PROCEDURE:  Procedure(s): ROBOTIC ASSISTED LAPAROSCOPIC RADICAL PROSTATECTOMY (N/A) PELVIC LYMPHADENECTOMY (Bilateral)  SURGEON:  Surgeon(s) and Role:    * Alexis Frock, MD - Primary    * Hollice Espy, MD - Assisting  PHYSICIAN ASSISTANT:   ASSISTANTS: Dr. Erlene Quan   ANESTHESIA:   local and general  EBL:  Total I/O In: 700 [I.V.:700] Out: 58 [Blood:75]  BLOOD ADMINISTERED:none  DRAINS: 1 - JP to bulb, 2 - Foley to gravity   LOCAL MEDICATIONS USED:  MARCAINE     SPECIMEN:  Source of Specimen:  1 -bilateral pelvic lymph nodes, 2 - periprostatic fat, 3 - prostatectomy  DISPOSITION OF SPECIMEN:  PATHOLOGY  COUNTS:  YES  TOURNIQUET:  * No tourniquets in log *  DICTATION: .Other Dictation: Dictation Number [id number]  PLAN OF CARE: Admit for overnight observation  PATIENT DISPOSITION:  PACU - hemodynamically stable.   Delay start of Pharmacological VTE agent (>24hrs) due to surgical blood loss or risk of bleeding: yes

## 2016-04-22 DIAGNOSIS — N401 Enlarged prostate with lower urinary tract symptoms: Secondary | ICD-10-CM | POA: Diagnosis not present

## 2016-04-22 DIAGNOSIS — Z888 Allergy status to other drugs, medicaments and biological substances status: Secondary | ICD-10-CM | POA: Diagnosis not present

## 2016-04-22 DIAGNOSIS — Z86010 Personal history of colonic polyps: Secondary | ICD-10-CM | POA: Diagnosis not present

## 2016-04-22 DIAGNOSIS — R3915 Urgency of urination: Secondary | ICD-10-CM | POA: Diagnosis not present

## 2016-04-22 DIAGNOSIS — R0683 Snoring: Secondary | ICD-10-CM | POA: Diagnosis not present

## 2016-04-22 DIAGNOSIS — I10 Essential (primary) hypertension: Secondary | ICD-10-CM | POA: Diagnosis not present

## 2016-04-22 DIAGNOSIS — E785 Hyperlipidemia, unspecified: Secondary | ICD-10-CM | POA: Diagnosis not present

## 2016-04-22 DIAGNOSIS — Z79899 Other long term (current) drug therapy: Secondary | ICD-10-CM | POA: Diagnosis not present

## 2016-04-22 DIAGNOSIS — Z9852 Vasectomy status: Secondary | ICD-10-CM | POA: Diagnosis not present

## 2016-04-22 DIAGNOSIS — C61 Malignant neoplasm of prostate: Secondary | ICD-10-CM | POA: Diagnosis not present

## 2016-04-22 DIAGNOSIS — I252 Old myocardial infarction: Secondary | ICD-10-CM | POA: Diagnosis not present

## 2016-04-22 DIAGNOSIS — G4733 Obstructive sleep apnea (adult) (pediatric): Secondary | ICD-10-CM | POA: Diagnosis not present

## 2016-04-22 DIAGNOSIS — K219 Gastro-esophageal reflux disease without esophagitis: Secondary | ICD-10-CM | POA: Diagnosis not present

## 2016-04-22 DIAGNOSIS — R079 Chest pain, unspecified: Secondary | ICD-10-CM | POA: Diagnosis not present

## 2016-04-22 LAB — BASIC METABOLIC PANEL
ANION GAP: 7 (ref 5–15)
BUN: 12 mg/dL (ref 6–20)
CHLORIDE: 103 mmol/L (ref 101–111)
CO2: 27 mmol/L (ref 22–32)
CREATININE: 0.98 mg/dL (ref 0.61–1.24)
Calcium: 8.4 mg/dL — ABNORMAL LOW (ref 8.9–10.3)
GFR calc non Af Amer: >60 mL/min (ref >60)
Glucose, Bld: 132 mg/dL — ABNORMAL HIGH (ref 65–99)
POTASSIUM: 3.6 mmol/L (ref 3.5–5.1)
SODIUM: 137 mmol/L (ref 135–145)

## 2016-04-22 LAB — HEMOGLOBIN AND HEMATOCRIT, BLOOD
HCT: 37.5 % — ABNORMAL LOW (ref 40.0–52.0)
Hemoglobin: 12.5 g/dL — ABNORMAL LOW (ref 13.0–18.0)

## 2016-04-22 MED ORDER — OXYCODONE HCL 5 MG PO TABS
5.0000 mg | ORAL_TABLET | ORAL | Status: DC | PRN
  Administered 2016-04-22 – 2016-04-23 (×3): 5 mg via ORAL
  Administered 2016-04-23: 10 mg via ORAL
  Filled 2016-04-22: qty 1
  Filled 2016-04-22 (×2): qty 2
  Filled 2016-04-22: qty 1

## 2016-04-22 NOTE — Progress Notes (Signed)
1 Day Post-Op Subjective: The patient is doing well this AM.  Had difficulty with pain overnight but seems to be improved this morning. Very anxious about discharge.  No nausea or vomiting.   Objective: Vital signs in last 24 hours: Temp:  [97.9 F (36.6 C)-99.8 F (37.7 C)] 98.7 F (37.1 C) (04/11 0857) Pulse Rate:  [52-92] 66 (04/11 0857) Resp:  [11-20] 16 (04/11 0616) BP: (132-153)/(63-96) 132/82 (04/11 0857) SpO2:  [98 %-100 %] 100 % (04/11 0616) Weight:  [229 lb 9.6 oz (104.1 kg)] 229 lb 9.6 oz (104.1 kg) (04/10 1528)  Intake/Output from previous day: 04/10 0701 - 04/11 0700 In: 2297.6 [P.O.:240; I.V.:2057.6] Out: 1285 [Urine:1050; Drains:130; Blood:75] Intake/Output this shift: Total I/O In: -  Out: 21 [Drains:45]  Physical Exam:  General: Alert and oriented. CV: RRR Lungs: Clear bilaterally. GI: Soft, Nondistended. Incisions: Clean and dry. Urine: Clear, Foley in place Extremities: Nontender, no erythema, no edema.  Lab Results:  Recent Labs  04/21/16 0654 04/21/16 1614 04/22/16 0413  HGB 13.3 13.4 12.5*  HCT 39.0 40.5 37.5*          Recent Labs  04/22/16 0413  CREATININE 0.98           Results for orders placed or performed during the hospital encounter of 04/21/16 (from the past 24 hour(s))  Hemoglobin and hematocrit, blood     Status: None   Collection Time: 04/21/16  4:14 PM  Result Value Ref Range   Hemoglobin 13.4 13.0 - 18.0 g/dL   HCT 40.5 40.0 - 52.0 %  Hemoglobin and hematocrit, blood     Status: Abnormal   Collection Time: 04/22/16  4:13 AM  Result Value Ref Range   Hemoglobin 12.5 (L) 13.0 - 18.0 g/dL   HCT 37.5 (L) 40.0 - 93.7 %  Basic metabolic panel     Status: Abnormal   Collection Time: 04/22/16  4:13 AM  Result Value Ref Range   Sodium 137 135 - 145 mmol/L   Potassium 3.6 3.5 - 5.1 mmol/L   Chloride 103 101 - 111 mmol/L   CO2 27 22 - 32 mmol/L   Glucose, Bld 132 (H) 65 - 99 mg/dL   BUN 12 6 - 20 mg/dL   Creatinine, Ser  0.98 0.61 - 1.24 mg/dL   Calcium 8.4 (L) 8.9 - 10.3 mg/dL   GFR calc non Af Amer >60 >60 mL/min   GFR calc Af Amer >60 >60 mL/min   Anion gap 7 5 - 15    Assessment/Plan: POD# 1 s/p robotic prostatectomy with bilateral pelvic lymph node dissection.  1) Ambulate, Incentive spirometry 2) Advance diet as tolerated 3) Transition to oral pain medication 4) Foley teaching 5) d/c JP 6) date discharge later today   Hollice Espy, MD   LOS: 1 day   Hollice Espy 04/22/2016, 10:31 AM

## 2016-04-22 NOTE — Progress Notes (Signed)
Left abdominal drain removed without difficulty. Dry dressing applied covered with transparent dressing. Spouse at bedside

## 2016-04-22 NOTE — Progress Notes (Signed)
Pt. Refused to take irbesartan states he doesn't take this medication he takes telmisartan. Pt. States someone should have told him they were going to subsitute his medication before just doing it, spouse at bedside states that someone (she couldn't remember who) did tell pt. That this particular medication would be replaced because facility does not carry telmisartan. Spouse states to pt. This was her second time hearing this information in reagrds to telmisartan. Dr Erlene Quan made aware states that pt. Will be discharge today and can to schedule dose at home. Pt. Made aware

## 2016-04-23 DIAGNOSIS — R3915 Urgency of urination: Secondary | ICD-10-CM | POA: Diagnosis not present

## 2016-04-23 DIAGNOSIS — I252 Old myocardial infarction: Secondary | ICD-10-CM | POA: Diagnosis not present

## 2016-04-23 DIAGNOSIS — Z888 Allergy status to other drugs, medicaments and biological substances status: Secondary | ICD-10-CM | POA: Diagnosis not present

## 2016-04-23 DIAGNOSIS — E785 Hyperlipidemia, unspecified: Secondary | ICD-10-CM | POA: Diagnosis not present

## 2016-04-23 DIAGNOSIS — Z79899 Other long term (current) drug therapy: Secondary | ICD-10-CM | POA: Diagnosis not present

## 2016-04-23 DIAGNOSIS — I10 Essential (primary) hypertension: Secondary | ICD-10-CM | POA: Diagnosis not present

## 2016-04-23 DIAGNOSIS — C61 Malignant neoplasm of prostate: Principal | ICD-10-CM

## 2016-04-23 DIAGNOSIS — G4733 Obstructive sleep apnea (adult) (pediatric): Secondary | ICD-10-CM | POA: Diagnosis not present

## 2016-04-23 DIAGNOSIS — R079 Chest pain, unspecified: Secondary | ICD-10-CM | POA: Diagnosis not present

## 2016-04-23 DIAGNOSIS — N401 Enlarged prostate with lower urinary tract symptoms: Secondary | ICD-10-CM | POA: Diagnosis not present

## 2016-04-23 DIAGNOSIS — K219 Gastro-esophageal reflux disease without esophagitis: Secondary | ICD-10-CM | POA: Diagnosis not present

## 2016-04-23 DIAGNOSIS — Z9852 Vasectomy status: Secondary | ICD-10-CM | POA: Diagnosis not present

## 2016-04-23 DIAGNOSIS — Z86010 Personal history of colonic polyps: Secondary | ICD-10-CM | POA: Diagnosis not present

## 2016-04-23 DIAGNOSIS — R0683 Snoring: Secondary | ICD-10-CM | POA: Diagnosis not present

## 2016-04-23 LAB — SURGICAL PATHOLOGY

## 2016-04-23 NOTE — Discharge Summary (Signed)
Date of admission: 04/21/2016  Date of discharge: 04/23/2016  Admission diagnosis: Moderate risk prostate cancer  Discharge diagnosis: Moderate risk prostate cancer  Secondary diagnoses:  Patient Active Problem List   Diagnosis Date Noted  . Prostate cancer ([place]) 02/24/2016  . Urinary urgency 02/24/2016  . Chest pain 01/24/2016  . NSTEMI (non-ST elevated myocardial infarction) ([place]) 01/24/2016  . Elevated PSA 12/30/2015  . Urinary frequency 12/30/2015  . BPH (benign prostatic hyperplasia) 11/27/2015  . Dyslipidemia 11/27/2015  . Essential hypertension 11/27/2015  . Hyperlipidemia 11/27/2015  . Hypertension 11/27/2015  . Snoring 11/27/2015  . Colon adenoma 01/21/2012    History and Physical: For full details, please see admission history and physical. Briefly, Garrett Watkins is a 56 y.o. year old patient with an elevated and rising PSA found to have multifocal right-sided adenocarcinoma of the prostate, moderate risk.   Patient underwent robotic-assisted laparoscopic radical prostatectomy with bilateral pelvic lymphadenectomy for definitive treatment of his prostate cancer with Dr. Tresa Moore on 04/21/2016.  Hospital Course: Patient tolerated the procedure well.  He was then transferred to the floor after an uneventful PACU stay.  His hospital course was uncomplicated.  On POD#2 he had met discharge criteria: was eating a regular diet, was up and ambulating independently,  pain was well controlled, was voiding without a catheter, and was ready to for discharge.   Laboratory values:   Recent Labs  04/21/16 0654 04/21/16 1614 04/22/16 0413  HGB 13.3 13.4 12.5*  HCT 39.0 40.5 37.5*    Recent Labs  04/21/16 0654 04/22/16 0413  NA 142 137  K 3.4* 3.6  CL  --  103  CO2  --  27  GLUCOSE 81 132*  BUN  --  12  CREATININE  --  0.98  CALCIUM  --  8.4*   No results for input(s): LABPT, INR in the last 72 hours. No results for input(s): LABURIN in the last 72 hours. Results for  orders placed or performed during the hospital encounter of 04/10/16  Urine culture     Status: None   Collection Time: 04/10/16  8:48 AM  Result Value Ref Range Status   Specimen Description URINE, CLEAN CATCH  Final   Special Requests NONE  Final   Culture   Final    NO GROWTH Performed at [facility], [place] [address] [place], [place] [address]    Report Status 04/11/2016 FINAL  Final   Physical exam Constitutional: Well nourished. Alert and oriented, No acute distress. HEENT:  AT, moist mucus membranes. Trachea midline, no masses. Cardiovascular: No clubbing, cyanosis, or edema. Respiratory: Normal respiratory effort, no increased work of breathing. GI: Abdomen is soft, non tender, non distended, no abdominal masses. Liver and spleen not palpable.  No hernias appreciated.  Stool sample for occult testing is not indicated.  JP site clean and dry with gauze and Tegaderm in place. GU: No CVA tenderness.  No bladder fullness or masses.  Foley in place with clear yellow urine. Skin: No rashes, bruises or suspicious lesions. Lymph: No cervical or inguinal adenopathy. Neurologic: Grossly intact, no focal deficits, moving all 4 extremities. Psychiatric: Normal mood and affect.  Disposition: Home  Discharge instruction: The patient was instructed to be ambulatory but told to refrain from heavy lifting, strenuous activity, or driving.    Discharge medications: Allergies as of 04/23/2016      Reactions   Amlodipine Other (See Comments)   Swelling in ankles (edema)---with higher doses per patient.  Medication List    STOP taking these medications   cloNIDine 0.2 MG tablet Commonly known as:  CATAPRES   losartan 100 MG tablet Commonly known as:  COZAAR     TAKE these medications   carvedilol 12.5 MG tablet Commonly known as:  COREG Take 12.5 mg by mouth 2 (two) times daily.   cephALEXin 500 MG capsule Commonly known as:  KEFLEX Take 1 capsule (500 mg total) by  mouth 2 (two) times daily. X 3 days. Begin day before next Urology appointment.   hydrochlorothiazide 25 MG tablet Commonly known as:  HYDRODIURIL Take 25 mg by mouth daily.   HYDROcodone-acetaminophen 5-325 MG tablet Commonly known as:  NORCO/VICODIN Take 1 tablet by mouth every 6 (six) hours as needed for moderate pain or severe pain. Post-operatively   multivitamin with minerals Tabs tablet Take 1 tablet by mouth daily.   NYQUIL PO Take 30 mLs by mouth at bedtime as needed (for sleep.).   senna-docusate 8.6-50 MG tablet Commonly known as:  Senokot-S Take 1 tablet by mouth 2 (two) times daily. While taking strong pain meds to prevent constipation.   telmisartan 80 MG tablet Commonly known as:  MICARDIS Take 80 mg by mouth daily.   TONALIN SAFFLOWER OIL CLA PO Take 800 mg by mouth 2 (two) times daily.   TURMERIC PO Take 1,200 mg by mouth daily. TURMERIC ROOT EXTRACT       Followup:  Follow-up Information    [facility] Follow up.   Why:  Office will call to arrange voiding trial in abotu 7-10 days.

## 2016-04-23 NOTE — Progress Notes (Signed)
Patient discharged to home as ordered. Patient as his wife educated on changing foley to a leg bag patient wife changed the bag over. Patient is alert and oriented ambulates without assistance. No acute distress noted. IV discontinued site clean dry and intact. Wife is taking patient home

## 2016-04-30 ENCOUNTER — Ambulatory Visit: Payer: BLUE CROSS/BLUE SHIELD

## 2016-04-30 DIAGNOSIS — C61 Malignant neoplasm of prostate: Secondary | ICD-10-CM

## 2016-04-30 NOTE — Progress Notes (Signed)
Pt presented today for foley removal post prostatectomy. Pt tolerated well. No s/s of adverse reaction noted. Reinforced with pt s/s of infection. Pt voiced understanding.

## 2016-05-01 ENCOUNTER — Telehealth: Payer: Self-pay | Admitting: Urology

## 2016-05-01 NOTE — Telephone Encounter (Signed)
error 

## 2016-05-04 ENCOUNTER — Telehealth: Payer: Self-pay | Admitting: Physical Therapy

## 2016-05-04 NOTE — Telephone Encounter (Signed)
PT phoned pt to check in following his prostate surgery 2 weeks ago. PT and pt agreed to cancel his appt scheduled on 05/07/15 and to keep his appt for 05/18/16 which makes him 4 weeks s/p.  Pt feels pain but has noticed his leakage has improved compared to last week. PT clarified his pelvic floor exercises with instruction to have a resting period between long contractions. Pt also answered his questions. PT left message with Dr. Zettie Pho office for clearance. Waiting to hear back from his office. Pt will be contacted to confirm his future PT appts based on MD's clearance.

## 2016-05-05 ENCOUNTER — Ambulatory Visit (INDEPENDENT_AMBULATORY_CARE_PROVIDER_SITE_OTHER): Payer: BLUE CROSS/BLUE SHIELD

## 2016-05-05 VITALS — BP 159/82 | HR 85

## 2016-05-05 DIAGNOSIS — R300 Dysuria: Secondary | ICD-10-CM | POA: Diagnosis not present

## 2016-05-05 LAB — MICROSCOPIC EXAMINATION: RBC, UA: >30 /hpf — ABNORMAL HIGH (ref 0–?)

## 2016-05-05 LAB — URINALYSIS, COMPLETE
BILIRUBIN UA: NEGATIVE
Glucose, UA: NEGATIVE
Ketones, UA: NEGATIVE
NITRITE UA: NEGATIVE
PH UA: 6 (ref 5.0–7.5)
Specific Gravity, UA: 1.025 (ref 1.005–1.030)
Urobilinogen, Ur: 0.2 mg/dL (ref 0.2–1.0)

## 2016-05-05 LAB — BLADDER SCAN AMB NON-IMAGING

## 2016-05-05 MED ORDER — CEPHALEXIN 500 MG PO CAPS: 500.0000 mg | ORAL_CAPSULE | Freq: Two times a day (BID) | ORAL | 0 refills | Status: DC

## 2016-05-05 NOTE — Progress Notes (Signed)
Patient present today complaining of severe dysuria that started over the weekend. Patient had foley removed last week and has been voiding fine but started having pain on Saturday and it has been worse each day. A urinalysis was done today and a PVR- 45ml. Urine today does look suspicious for infection patient was started on Keflex and given samples of Uribell per Zara Council, PAC. Patient also was given notification of path results that margins were clear and a copy of the report per Dr. Tresa Moore.

## 2016-05-06 ENCOUNTER — Ambulatory Visit: Payer: BLUE CROSS/BLUE SHIELD | Admitting: Urology

## 2016-05-06 ENCOUNTER — Encounter: Payer: BLUE CROSS/BLUE SHIELD | Admitting: Physical Therapy

## 2016-05-07 ENCOUNTER — Telehealth: Payer: Self-pay

## 2016-05-07 ENCOUNTER — Other Ambulatory Visit: Payer: Self-pay | Admitting: Urology

## 2016-05-07 LAB — CULTURE, URINE COMPREHENSIVE

## 2016-05-07 MED ORDER — SULFAMETHOXAZOLE-TRIMETHOPRIM 800-160 MG PO TABS: 1.0000 | ORAL_TABLET | Freq: Two times a day (BID) | ORAL | 0 refills | Status: DC

## 2016-05-07 NOTE — Telephone Encounter (Signed)
Spoke with pt in reference to Garrett Watkins post surgery. Pt stated that he has had several and they are very soft. Pt stated that since starting abx BM have become more soft. Reinforced with pt someone will give him a call as soon as we get the ucx results. Pt voiced understanding.

## 2016-05-07 NOTE — Telephone Encounter (Signed)
Has patient had a bowel movement since his surgery?  If not, this may be the source of his penile pain.  I recommend to continue with the stool softners and increase fiber in his diet.  I NO NOT recommend using enemas.

## 2016-05-07 NOTE — Telephone Encounter (Signed)
Pt called stating he continues to have severe burning at the tip of his penis. Pt stated that pain is so severe it wakes him up at night. Reinforced with pt ucx results are not back yet therefore we are unsure if he is on the correct abx. Reinforced with pt to continue taking uribel and drink plenty of water. Pt voiced understanding stating he is doing both religiously. Pt then request an injection or something else to help with the dysuria. Please advise.

## 2016-05-08 ENCOUNTER — Telehealth: Payer: Self-pay

## 2016-05-08 NOTE — Telephone Encounter (Signed)
-----   Message from Nori Riis, PA-C sent at 05/07/2016  5:08 PM EDT ----- I have spoken to Mr. [place] and let him know that he needs to stop the Keflex and start the Septra DS.

## 2016-05-11 ENCOUNTER — Encounter: Payer: Self-pay | Admitting: Urology

## 2016-05-11 ENCOUNTER — Telehealth: Payer: Self-pay

## 2016-05-11 ENCOUNTER — Ambulatory Visit (INDEPENDENT_AMBULATORY_CARE_PROVIDER_SITE_OTHER): Payer: BLUE CROSS/BLUE SHIELD | Admitting: Urology

## 2016-05-11 VITALS — BP 152/79 | HR 70 | Ht 73.0 in | Wt 224.1 lb

## 2016-05-11 DIAGNOSIS — C61 Malignant neoplasm of prostate: Secondary | ICD-10-CM

## 2016-05-11 NOTE — Progress Notes (Addendum)
05/11/2016 10:44 AM   Garrett Watkins 1960-03-25 [id number]  Referring provider: Kirk Ruths, MD [facility] [place], [place] [address]  Chief Complaint  Patient presents with  . Routine Post Op    Abdominal swelling     HPI: Pt s/p 1. Robotic-assisted laparoscopic radical prostatectomy. 2. Bilateral pelvic lymphadenectomy. 04/30/2016. Path T2, Gleason 3+4=7, nodes negative (8 nodes examined), margins negative.  Foley removed 04/30/2016. Dysuria developed and he was started on Bactrim for an enterobacter UTI. He's getting a better stream and can hold for a couple of hours. He's wearing pads x 1 per day. Noc x 3-4, but he feels like he's voiding to avoid wetting the bed.   Today, he presents with more noticeable swelling of right side of the abdomen. He's noted it since surgery but was worse over weekend when he was up and active. He's had no fever. Voiding better as above. BM are normal/daily. No N/V.    PMH: Past Medical History:  Diagnosis Date  . BPH (benign prostatic hyperplasia)   . Colon adenoma   . Dyslipidemia   . GERD (gastroesophageal reflux disease)    history of  . Hypertension   . OSA (obstructive sleep apnea)   . Prostate cancer [facility])     Surgical History: Past Surgical History:  Procedure Laterality Date  . CARDIAC CATHETERIZATION Right 01/24/2016   Procedure: Left Heart Cath and Coronary Angiography;  Surgeon: Corey Skains, MD;  Location: [place] [facility];  Service: Cardiovascular;  Laterality: Right;  . CYST EXCISION    . LYMPHADENECTOMY Bilateral 04/21/2016   Procedure: PELVIC LYMPHADENECTOMY;  Surgeon: Alexis Frock, MD;  Location: [facility] ORS;  Service: Urology;  Laterality: Bilateral;  . NO PAST SURGERIES    . ROBOT ASSISTED LAPAROSCOPIC RADICAL PROSTATECTOMY N/A 04/21/2016   Procedure: ROBOTIC ASSISTED LAPAROSCOPIC RADICAL PROSTATECTOMY;  Surgeon: Alexis Frock, MD;  Location: [facility] ORS;  Service: Urology;   Laterality: N/A;  . VASECTOMY      Home Medications:  Allergies as of 05/11/2016      Reactions   Amlodipine Other (See Comments)   Swelling in ankles (edema)---with higher doses per patient.      Medication List       Accurate as of 05/11/16 10:44 AM. Always use your most recent med list.          carvedilol 12.5 MG tablet Commonly known as:  COREG Take 12.5 mg by mouth 2 (two) times daily.   hydrochlorothiazide 25 MG tablet Commonly known as:  HYDRODIURIL Take 25 mg by mouth daily.   ibuprofen 200 MG tablet Commonly known as:  ADVIL,MOTRIN Take 200 mg by mouth every 6 (six) hours as needed.   multivitamin with minerals Tabs tablet Take 1 tablet by mouth daily.   senna-docusate 8.6-50 MG tablet Commonly known as:  Senokot-S Take 1 tablet by mouth 2 (two) times daily. While taking strong pain meds to prevent constipation.   sulfamethoxazole-trimethoprim 800-160 MG tablet Commonly known as:  BACTRIM DS,SEPTRA DS Take 1 tablet by mouth every 12 (twelve) hours.   telmisartan 80 MG tablet Commonly known as:  MICARDIS Take 80 mg by mouth daily.   TONALIN SAFFLOWER OIL CLA PO Take 800 mg by mouth 2 (two) times daily.   TURMERIC PO Take 1,200 mg by mouth daily. TURMERIC ROOT EXTRACT       Allergies:  Allergies  Allergen Reactions  . Amlodipine Other (See Comments)    Swelling in ankles (  edema)---with higher doses per patient.    Family History: Family History  Problem Relation Age of Onset  . CAD Mother   . Prostate cancer Neg Hx     Social History:  reports that he has never smoked. He has never used smokeless tobacco. He reports that he does not drink alcohol or use drugs.  ROS: UROLOGY Frequent Urination?: Yes Hard to postpone urination?: No Burning/pain with urination?: Yes Get up at night to urinate?: No Leakage of urine?: Yes Urine stream starts and stops?: No Trouble starting stream?: No Do you have to strain to urinate?: No Blood in  urine?: No Urinary tract infection?: No Sexually transmitted disease?: No Injury to kidneys or bladder?: No Painful intercourse?: No Weak stream?: No Erection problems?: No Penile pain?: No  Gastrointestinal Nausea?: No Vomiting?: No Indigestion/heartburn?: No Diarrhea?: No Constipation?: No  Constitutional Fever: No Night sweats?: No Weight loss?: No Fatigue?: No  Skin Skin rash/lesions?: No Itching?: No  Eyes Blurred vision?: No Double vision?: No  Ears/Nose/Throat Sore throat?: No Sinus problems?: No  Hematologic/Lymphatic Swollen glands?: No Easy bruising?: No  Cardiovascular Leg swelling?: No Chest pain?: No  Respiratory Cough?: No Shortness of breath?: No  Endocrine Excessive thirst?: No  Musculoskeletal Back pain?: No Joint pain?: No  Neurological Headaches?: No Dizziness?: No  Psychologic Depression?: No Anxiety?: No  Physical Exam: BP (!) 152/79 (BP Location: Left Arm, Patient Position: Sitting, Cuff Size: Normal)   Pulse 70   Ht 6\' 1"  (1.854 m)   Wt 101.7 kg (224 lb 1.6 oz)   BMI 29.57 kg/m   Constitutional:  Alert and oriented, No acute distress. Cardiovascular: No clubbing, cyanosis, or edema. Respiratory: Normal respiratory effort, no increased work of breathing. GI: Abdomen is soft, nontender, nondistended, no abdominal masses; incisions are C/D/I without E/E/I. No herniation. Feels like simple edema around the right two port sites in the sub-q tissues. R>L. No fluctuance either.  GU: No CVA tenderness. Skin: No rashes, bruises or suspicious lesions. Lymph: No cervical or inguinal adenopathy. Neurologic: Grossly intact, no focal deficits, moving all 4 extremities. Psychiatric: Normal mood and affect.  Laboratory Data: Lab Results  Component Value Date   WBC 4.1 04/10/2016   HGB 12.5 (L) 04/22/2016   HCT 37.5 (L) 04/22/2016   MCV 89.4 04/10/2016   PLT 179 04/10/2016    Lab Results  Component Value Date   CREATININE  0.98 04/22/2016    No results found for: PSA  No results found for: TESTOSTERONE  No results found for: HGBA1C  Urinalysis    Component Value Date/Time   COLORURINE YELLOW (A) 04/10/2016 0848   APPEARANCEUR Cloudy (A) 05/05/2016 0853   LABSPEC 1.029 04/10/2016 0848   PHURINE 6.0 04/10/2016 0848   GLUCOSEU Negative 05/05/2016 0853   HGBUR NEGATIVE 04/10/2016 0848   BILIRUBINUR Negative 05/05/2016 0853   KETONESUR NEGATIVE 04/10/2016 0848   PROTEINUR 3+ (A) 05/05/2016 0853   PROTEINUR NEGATIVE 04/10/2016 0848   NITRITE Negative 05/05/2016 0853   NITRITE NEGATIVE 04/10/2016 0848   LEUKOCYTESUR 1+ (A) 05/05/2016 0853     Assessment & Plan:    s/p RRP - discussed path. Check PSA in about 3 weeks. Pt asked about it.   Swelling - benign exam. Feels like sub-q edema from the two primary port sites/drain site likely related to increased activity. Reassured and we'll continue to monitor. Discussed return precautions - fever, N/V, uncontrolled pain, etc.   SUI - has a PT appt. May try condom catheter for sleeping.  There are no diagnoses linked to this encounter.  No Follow-up on file.  Festus Aloe, [facility] [address], [place], [place] [address] [phone]

## 2016-05-11 NOTE — Telephone Encounter (Signed)
Pt called complaining of swelling in the Right side of his abdomen post Prostatectomy. He states the swelling is on the same said the drainage tube was place. He states no pain, but discomfort from the swelling.

## 2016-05-12 ENCOUNTER — Ambulatory Visit: Payer: BLUE CROSS/BLUE SHIELD

## 2016-05-18 ENCOUNTER — Ambulatory Visit: Payer: BLUE CROSS/BLUE SHIELD | Attending: Urology | Admitting: Physical Therapy

## 2016-05-18 DIAGNOSIS — R278 Other lack of coordination: Secondary | ICD-10-CM | POA: Diagnosis not present

## 2016-05-18 DIAGNOSIS — M791 Myalgia, unspecified site: Secondary | ICD-10-CM

## 2016-05-18 DIAGNOSIS — M6281 Muscle weakness (generalized): Secondary | ICD-10-CM | POA: Insufficient documentation

## 2016-05-18 NOTE — Patient Instructions (Signed)
Figure -4 stretch on chair  5 breaths    __________   Elnoria Howard TECHNIQUES  ? These techniques are to be used to suppress those abnormally strong URGES to urinate, especially after you have already urinated < 1hr ago.  ? These steps do not have to be followed in order, and not all steps have to be used.   ? The purpose of these steps is to help you regain control of your bladder, to reduce the amount of urinary urgency, frequency, or leaking.  ? They take practice to master in controlling urgency.  Allow yourself to be okay with leaking when you first start practicing these steps. ? Practice these steps first at home, when you do not have to worry as much about leaking.  1. SIT DOWN.  Pressure on the pelvic floor inhibits the bladder.  Further pressure may help, such as sitting on a small rolled up towel. 2. LIGHT "KEGELS" using elevator imagery.  Breathe in, feel pelvic floor lower to "basement" level by the end of the inhalation. Exhale, feel pelvic floor gradually move up to "1st floor" which is the neutral position of pelvic floor. At the end of exhalation, feel pelvic floor lift higher at which you will perform a quick light squeeze (the muscles that hold back gas and urine).  Do not do hard, maximal contractions, as this will quickly fatigue the muscles and cause leakage. Perform this 5 repetitions in this pattern.  3. BREATHE & STAY CALM.  Breathing slowly and remaining calm will inhibit your sympathetic nervous system, which will in turn calm the bladder.   4. DISTRACTION.  Sit with a project that will engage your mind.  Anything that works for you - reading, word puzzles, crochet, knitting, checking email, balancing the checkbook, and so on. 5. VISUALIZATION.  Imagine that you are in a place/situation in which either you cannot or do not want to leave.  Examples: in a car and cannot stop; lying on a beach with a far walk to a restroom; at dinner with someone special.  If the urge  persists after practicing these steps and feel you must go to the bathroom, then it is imperative that you . . . . ? Walk slowly and calmly to the bathroom ? Maintain calm breathing ? Refrain from undressing until you are standing over the toilet Rushing to the restroom will only encourage the strong bladder urges and leaking.  Again, the more you practice, the easier these steps will become.

## 2016-05-19 NOTE — Therapy (Signed)
[place] MAIN [facility] SERVICES [address] [place], [place], [address] Phone: [phone]   Fax:  [phone]  Physical Therapy Evaluation  Patient Details  Name: Garrett Watkins MRN: [id number] Date of Birth: 03-Jul-1960 Referring Provider: Tresa Moore  Encounter Date: 05/18/2016      PT End of Session - 05/19/16 2056    Visit Number 1   Number of Visits 12   PT Start Time 0910   PT Stop Time 1010   PT Time Calculation (min) 60 min   Activity Tolerance Patient tolerated treatment well   Behavior During Therapy [facility] for tasks assessed/performed      Past Medical History:  Diagnosis Date  . BPH (benign prostatic hyperplasia)   . Colon adenoma   . Dyslipidemia   . GERD (gastroesophageal reflux disease)    history of  . Hypertension   . OSA (obstructive sleep apnea)   . Prostate cancer [facility])     Past Surgical History:  Procedure Laterality Date  . CARDIAC CATHETERIZATION Right 01/24/2016   Procedure: Left Heart Cath and Coronary Angiography;  Surgeon: Corey Skains, MD;  Location: [place] [facility];  Service: Cardiovascular;  Laterality: Right;  . CYST EXCISION    . LYMPHADENECTOMY Bilateral 04/21/2016   Procedure: PELVIC LYMPHADENECTOMY;  Surgeon: Alexis Frock, MD;  Location: [facility] ORS;  Service: Urology;  Laterality: Bilateral;  . NO PAST SURGERIES    . ROBOT ASSISTED LAPAROSCOPIC RADICAL PROSTATECTOMY N/A 04/21/2016   Procedure: ROBOTIC ASSISTED LAPAROSCOPIC RADICAL PROSTATECTOMY;  Surgeon: Alexis Frock, MD;  Location: [facility] ORS;  Service: Urology;  Laterality: N/A;  . VASECTOMY      There were no vitals filed for this visit.       Subjective Assessment - 05/19/16 2050    Subjective 1) Pt underwent radical prostatectomy on 04/21/16. Pt reports not having control over his bladdder. Pt gets up 9 x a night. Pain lets him know he has to [place]. Pain is located at the head of his penis.  7/10  level of pain .   Pain persists during  urination and fades away to a level of 2/10 after 15 min of urination. Pt has been able to hold it for most cases. Pads in the day 2 and wears 2 diapers at night but rarely soils the diapers. Pt gets up alot at night because he is afraid of soiling them. Pt no longer senses urge to urinate but has pain which is his signal to go to the bathroom.  Pt is not getting proper sleep. Pt has not been using his CPAP since surgery.  2) Pain with sitting around the anus, scrotum, and with initating/ emptying bowel movements. Pain has improved with bowel movments. Pt had a hemorrhoid and it has resolved with less pain.  Pt completed antibiotics for UTI related the catheter use. Pt feels embarassed about his Sx.         Pertinent History Prostate CA, physical active, enjoys working out includes sit ups and crunches   Patient Stated Goals be normal again, go to the bathroom and be able to hold his urine before, sleep better with less interrupted trips to the urination             [facility] PT Assessment - 05/19/16 2051      Assessment   Medical Diagnosis Prostate cancer   Referring Provider Manny     Precautions   Precautions None     Restrictions   Weight Bearing Restrictions  No     Balance Screen   Has the patient fallen in the past 6 months --  No     Observation/Other Assessments   Observations ankles under chair, poor postural engagement     Coordination   Gross Motor Movements are Fluid and Coordinated --  chest breathing, limited pelvic floor movement,accessory mm    Fine Motor Movements are Fluid and Coordinated --  increased diaphragmatic excursion following manual Tx     Other:   Other/ Comments breathholding with loaded activities     Flexibility   Soft Tissue Assessment /Muscle Length --  L hip more limited in hip flex/ER/abd to don shoes     Palpation   Spinal mobility increased thoracic mm tensions, hypomobility .    Palpation comment tensions/tenderness at L ischiocavernosus/  bulbospongious with reproduction of pain                  Pelvic Floor Special Questions - 05/19/16 2053    External Perineal Exam clothing doffed  with consent    External Palpation tightness and reproduction of penile pain with palaption on L ischiocavernosus  ( decreased post Tx)           [facility] Adult PT Treatment/Exercise - 05/19/16 2053      Therapeutic Activites    Therapeutic Activities --  see pt instructions     Neuro Re-ed    Neuro Re-ed Details  see pt instructions     Manual Therapy   Manual therapy comments STM , MET at  L ischiocavernosus, bulbospongious                      PT Long Term Goals - 05/19/16 2107      PT LONG TERM GOAL #1   Title Pt will decrease getting up at night from 9-10 x/ night to < 4x/ night in order to improve sleep. Pt will also use his CPAP machine nighlt     Time 12   Period Weeks   Status New     PT LONG TERM GOAL #2   Title Pt will report decreased pain from 7/10 to < 2/10 at the end of uriation across 5 episodes    Time 8   Period Weeks   Status New     PT LONG TERM GOAL #3   Title Pt will report being able to sit for > 30 min  in order to sit through community events    Baseline 10 min before pain onset    Time 8   Period Weeks   Status New     PT LONG TERM GOAL #4   Title Pt will report being able to sense the urge to urinate instead of the pain signal as the urge to urinate in order to sleep    Time 12   Period Weeks   Status New     PT LONG TERM GOAL #5   Title Pt will decrease his IPPS score from 35 pts severe to < 19 pts ( moderate) and 6 pts in QOL score to < 3 pts in order to improve function and QOL   Time 12   Period Weeks   Status New               Plan - 05/19/16 2100    Clinical Impression Statement Pt is a 56 yo male who is 4 weeks s/p radical prostatectomy. He reports urinary leakage, nocturia, urinary urgency triggered  by signals of pain.Marland Kitchen He also reports perineal pain  with sitting, bowel movements. Pt's clinical presentation includes overactive pelvic floor mm which is directly associated to his perineal pain. He also demo'd dyscoordination/weekaness of pelvic floor mm and limited education on how to return to his weight lifting/fitness routine with minimized risks for worsening his Sx.  Following Tx today, pt demo'd decreased mm tenderness and tensions at L pelvic floor mm. Pt will benefit from a biopsychosocial approach due to the emotional and social impact his Sx have had on his well  being.     Rehab Potential Good   PT Frequency 2x / week   PT Duration 12 weeks   PT Treatment/Interventions Neuromuscular re-education;Therapeutic exercise;Therapeutic activities;Moist Heat;Energy conservation;Patient/family education;Scar mobilization;Gait training;Stair training;Functional mobility training   Consulted and Agree with Plan of Care Patient      Patient will benefit from skilled therapeutic intervention in order to improve the following deficits and impairments:  Decreased coordination, Decreased safety awareness, Decreased strength, Decreased mobility, Decreased endurance, Hypomobility, Decreased range of motion, Postural dysfunction, Pain  Visit Diagnosis: Other lack of coordination  Muscle weakness (generalized)     Problem List Patient Active Problem List   Diagnosis Date Noted  . Prostate cancer ([place]) 02/24/2016  . Urinary urgency 02/24/2016  . Chest pain 01/24/2016  . NSTEMI (non-ST elevated myocardial infarction) ([place]) 01/24/2016  . Elevated PSA 12/30/2015  . Urinary frequency 12/30/2015  . BPH (benign prostatic hyperplasia) 11/27/2015  . Dyslipidemia 11/27/2015  . Essential hypertension 11/27/2015  . Hyperlipidemia 11/27/2015  . Hypertension 11/27/2015  . Snoring 11/27/2015  . Colon adenoma 01/21/2012    Jerl Mina ,PT, DPT, E-RYT  05/19/2016, 9:15 PM  [place] MAIN [facility] SERVICES [address] [place], [place], [address] Phone: [phone]   Fax:  [phone]  Name: Garrett Watkins MRN: [id number] Date of Birth: [birth year]

## 2016-05-20 ENCOUNTER — Ambulatory Visit: Payer: BLUE CROSS/BLUE SHIELD | Admitting: Physical Therapy

## 2016-05-20 DIAGNOSIS — R278 Other lack of coordination: Secondary | ICD-10-CM | POA: Diagnosis not present

## 2016-05-20 DIAGNOSIS — M6281 Muscle weakness (generalized): Secondary | ICD-10-CM

## 2016-05-20 DIAGNOSIS — M791 Myalgia, unspecified site: Secondary | ICD-10-CM

## 2016-05-20 NOTE — Patient Instructions (Addendum)
Work routine:  Mini squats:  All four corners of feet down the whole time 10 reps    Sidebend, high five the sky  Arm swings   Pat your self on the back  (chin tuck)  5 reps  Each side    ** follow the ergonmic guidelines for workstation _________  Midday: body scan     _____________  night time routine to help with sleep   2 hours before bed: no TV nor screens  Read a book to wind down  Self massage : knees propped with pillows5 mins  Body scan and also again after getting up in the middle of the night

## 2016-05-21 NOTE — Therapy (Signed)
[place] MAIN [facility] SERVICES [address] [place], [place], [address] Phone: [phone]   Fax:  [phone]  Physical Therapy Treatment  Patient Details  Name: Garrett Watkins MRN: [id number] Date of Birth: 1960-06-16 Referring Provider: Tresa Moore  Encounter Date: 05/20/2016      PT End of Session - 05/20/16 0914    Visit Number 2   Number of Visits 12   PT Start Time 0910   PT Stop Time 9233   PT Time Calculation (min) 65 min   Activity Tolerance Patient tolerated treatment well;No increased pain   Behavior During Therapy WFL for tasks assessed/performed      Past Medical History:  Diagnosis Date  . BPH (benign prostatic hyperplasia)   . Colon adenoma   . Dyslipidemia   . GERD (gastroesophageal reflux disease)    history of  . Hypertension   . OSA (obstructive sleep apnea)   . Prostate cancer [facility])     Past Surgical History:  Procedure Laterality Date  . CARDIAC CATHETERIZATION Right 01/24/2016   Procedure: Left Heart Cath and Coronary Angiography;  Surgeon: Corey Skains, MD;  Location: [facility] [facility];  Service: Cardiovascular;  Laterality: Right;  . CYST EXCISION    . LYMPHADENECTOMY Bilateral 04/21/2016   Procedure: PELVIC LYMPHADENECTOMY;  Surgeon: Alexis Frock, MD;  Location: [facility] OR[facility];  Service: Urology;  Laterality: Bilateral;  . NO PAST SURGERIES    . ROBOT ASSISTED LAPAROSCOPIC RADICAL PROSTATECTOMY N/A 04/21/2016   Procedure: ROBOTIC ASSISTED LAPAROSCOPIC RADICAL PROSTATECTOMY;  Surgeon: Alexis Frock, MD;  Location: [facility] ORS;  Service: Urology;  Laterality: N/A;  . VASECTOMY      There were no vitals filed for this visit.      Subjective Assessment - 05/20/16 0915    Subjective Pt reported he noticed slightly less pain in the pelvic area and stronger stream after last session. Pt tried the urge protocol technique but it did not seem to work. Pt tried to no get up and his trips decreased to 6x/ night but he  still does not feel he is getting sleep.    Pertinent History Prostate CA, physical active, enjoys working out includes sit ups and crunches   Patient Stated Goals be normal again, go to the bathroom and be able to hold his urine before, sleep better with less interrupted trips to the urination             [facility] PT Assessment - 05/21/16 1417      Squat   Comments anterior COM, poor knee alignment                   Pelvic Floor Special Questions - 05/21/16 1418    External Perineal Exam clothing doffed  with consent    External Palpation tightness at ischiocavernosus (less tedneress compared to last session), tightness at bulbopsongious, deep transverse perineal mm on L (decreased post Tx)           [facility] Adult PT Treatment/Exercise - 05/21/16 1418      Therapeutic Activites    Therapeutic Activities --  see pt instructions     Neuro Re-ed    Neuro Re-ed Details  see pt instructions     Manual Therapy   Manual therapy comments STM, MET at problem areas stated in assessment                      PT Long Term Goals - 05/19/16 2107  PT LONG TERM GOAL #1   Title Pt will decrease getting up at night from 9-10 x/ night to < 4x/ night in order to improve sleep. Pt will also use his CPAP machine nighlt     Time 12   Period Weeks   Status New     PT LONG TERM GOAL #2   Title Pt will report decreased pain from 7/10 to < 2/10 at the end of uriation across 5 episodes    Time 8   Period Weeks   Status New     PT LONG TERM GOAL #3   Title Pt will report being able to sit for > 30 min  in order to sit through community events    Baseline 10 min before pain onset    Time 8   Period Weeks   Status New     PT LONG TERM GOAL #4   Title Pt will report being able to sense the urge to urinate instead of the pain signal as the urge to urinate in order to sleep    Time 12   Period Weeks   Status New     PT LONG TERM GOAL #5   Title Pt will decrease  his IPPS score from 35 pts severe to < 19 pts ( moderate) and 6 pts in QOL score to < 3 pts in order to improve function and QOL   Time 12   Period Weeks   Status New               Plan - 05/21/16 1419    Clinical Impression Statement Pt is progressing well with stronger urine stream  and less penile pain. Following today's Tx, pt demo'd significaintly decreased pelvic floor mm tensions at 1st and 2nd layer of mm on L.  Replaced urge protocol with body scan technique. Suspect the 5 quick squeezes in urge protocol is not suitable for pt at this time due to overactive pelvic floor and pt reported no success with urge protocol to minimize nocturia.  Following body scan technique, pt reported feeling "heavier" and had a more relaxed appearance. Anticipate this technique will also help pt manage pain and help pt fall back asleep after initial trips to the bathroom at night. Pt was also provided education on ergonomic tips as well as pelvic floor/hip stretches and spinal mobility movements to perform during work breaks.  Pt demo'd correctly.  Pt continues to benefit from skilled PT.     Rehab Potential Good   PT Frequency 2x / week   PT Duration 12 weeks   PT Treatment/Interventions Neuromuscular re-education;Therapeutic exercise;Therapeutic activities;Moist Heat;Energy conservation;Patient/family education;Scar mobilization;Gait training;Stair training;Functional mobility training   Consulted and Agree with Plan of Care Patient      Patient will benefit from skilled therapeutic intervention in order to improve the following deficits and impairments:  Decreased coordination, Decreased safety awareness, Decreased strength, Decreased mobility, Decreased endurance, Hypomobility, Decreased range of motion, Postural dysfunction, Pain  Visit Diagnosis: Other lack of coordination  Muscle weakness (generalized)  Myalgia     Problem List Patient Active Problem List   Diagnosis Date Noted  .  Prostate cancer (HCC) 02/24/2016  . Urinary urgency 02/24/2016  . Chest pain 01/24/2016  . NSTEMI (non-ST elevated myocardial infarction) (HCC) 01/24/2016  . Elevated PSA 12/30/2015  . Urinary frequency 12/30/2015  . BPH (benign prostatic hyperplasia) 11/27/2015  . Dyslipidemia 11/27/2015  . Essential hypertension 11/27/2015  . Hyperlipidemia 11/27/2015  . Hypertension 11/27/2015  .   Snoring 11/27/2015  . Colon adenoma 01/21/2012    Yeung,Shin Yiing ,PT, DPT, E-RYT  05/21/2016, 2:26 PM  [facility] MAIN REHAB SERVICES [address] [place], [place], [address] Phone: [phone]   Fax:  [phone]  Name: Garrett Watkins MRN: [id number] Date of Birth: 06/27/1960   

## 2016-05-22 ENCOUNTER — Encounter: Payer: Self-pay | Admitting: Physical Therapy

## 2016-05-27 ENCOUNTER — Ambulatory Visit: Payer: BLUE CROSS/BLUE SHIELD | Admitting: Physical Therapy

## 2016-05-27 DIAGNOSIS — R278 Other lack of coordination: Secondary | ICD-10-CM

## 2016-05-27 DIAGNOSIS — M791 Myalgia, unspecified site: Secondary | ICD-10-CM

## 2016-05-27 DIAGNOSIS — M6281 Muscle weakness (generalized): Secondary | ICD-10-CM

## 2016-05-27 NOTE — Therapy (Signed)
[place] MAIN [facility] SERVICES [address] [place], [place], [address] Phone: [phone]   Fax:  [phone]  Physical Therapy Treatment  Patient Details  Name: Garrett Watkins MRN: [id number] Date of Birth: 1960/03/01 Referring Provider: Tresa Moore  Encounter Date: 05/27/2016      PT End of Session - 05/27/16 1618    Visit Number 3   Number of Visits 12   PT Start Time 1110   PT Stop Time 1856   PT Time Calculation (min) 55 min   Activity Tolerance Patient tolerated treatment well;No increased pain   Behavior During Therapy WFL for tasks assessed/performed      Past Medical History:  Diagnosis Date  . BPH (benign prostatic hyperplasia)   . Colon adenoma   . Dyslipidemia   . GERD (gastroesophageal reflux disease)    history of  . Hypertension   . OSA (obstructive sleep apnea)   . Prostate cancer [facility])     Past Surgical History:  Procedure Laterality Date  . CARDIAC CATHETERIZATION Right 01/24/2016   Procedure: Left Heart Cath and Coronary Angiography;  Surgeon: Corey Skains, MD;  Location: [place] [facility];  Service: Cardiovascular;  Laterality: Right;  . CYST EXCISION    . LYMPHADENECTOMY Bilateral 04/21/2016   Procedure: PELVIC LYMPHADENECTOMY;  Surgeon: Alexis Frock, MD;  Location: [facility] ORS;  Service: Urology;  Laterality: Bilateral;  . NO PAST SURGERIES    . ROBOT ASSISTED LAPAROSCOPIC RADICAL PROSTATECTOMY N/A 04/21/2016   Procedure: ROBOTIC ASSISTED LAPAROSCOPIC RADICAL PROSTATECTOMY;  Surgeon: Alexis Frock, MD;  Location: [facility] OR[facility];  Service: Urology;  Laterality: N/A;  . VASECTOMY      There were no vitals filed for this visit.      Subjective Assessment - 05/27/16 1112    Subjective Pt reported he noticed his leakage is more as he increases his activities ( mowing grass, picking up rocks and stones, light racking).  Sitting pain has gotten better by 33% and he has stopped taking pains past 4 days and is managing  okay with out pain meds. Penile pain is less when he is active with body turns or getting out of the chair. Pt is not feeling the pain to trigger the urge anymore and therefore he leaks more often.  Pt feels some pain after finsihing urination when he contracts the muscles. Pt stays mindful to not contract his muscles too early so he can completely empty urine.    Pertinent History Prostate CA, physical active, enjoys working out includes sit ups and crunches   Patient Stated Goals be normal again, go to the bathroom and be able to hold his urine before, sleep better with less interrupted trips to the urination             [facility] PT Assessment - 05/27/16 1602      Other:   Other/ Comments poor coactivation of deep core mm with simulated lawn mower pushing/raking. Poor alignment with self-selected stretches and one position involved forward folding at the waist      Palpation   Palpation comment no tensions/tenderness at L ischiocavernosus/ bulbospongious without reproduction of pain      Ambulation/Gait   Gait Comments breathholding, stiff spine                     [facility] Adult PT Treatment/Exercise - 05/27/16 1602      Therapeutic Activites    Therapeutic Activities --  see pt instructions:gait training:resistance at waist 10'x4  Neuro Re-ed    Neuro Re-ed Details  see pt instructions                PT Education - 05/27/16 1131    Education provided Yes   Education Details HEP   Person(s) Educated Patient   Methods Explanation;Demonstration;Tactile cues;Verbal cues;Handout   Comprehension Returned demonstration;Verbalized understanding             PT Long Term Goals - 05/19/16 2107      PT LONG TERM GOAL #1   Title Pt will decrease getting up at night from 9-10 x/ night to < 4x/ night in order to improve sleep. Pt will also use his CPAP machine nighlt     Time 12   Period Weeks   Status New     PT LONG TERM GOAL #2   Title Pt will report  decreased pain from 7/10 to < 2/10 at the end of uriation across 5 episodes    Time 8   Period Weeks   Status New     PT LONG TERM GOAL #3   Title Pt will report being able to sit for > 30 min  in order to sit through community events    Baseline 10 min before pain onset    Time 8   Period Weeks   Status New     PT LONG TERM GOAL #4   Title Pt will report being able to sense the urge to urinate instead of the pain signal as the urge to urinate in order to sleep    Time 12   Period Weeks   Status New     PT LONG TERM GOAL #5   Title Pt will decrease his IPPS score from 35 pts severe to < 19 pts ( moderate) and 6 pts in QOL score to < 3 pts in order to improve function and QOL   Time 12   Period Weeks   Status New               Plan - 05/27/16 1619    Clinical Impression Statement Pt is progressing well with good carry over with significantly decreased pelvic floor mm tensions and tenderness. Pt reports decreased pain but continues to have leakage. Pt was initiated to pelvic floor quick contractions today which pt demo'd correctly after training. Body mechanics were modified to minimize straining the pelvic floor during exertional activities with yard work. Pt's walking routine was recommended to increase gradually on leveled ground with co-activation of the pelvic floor mm with breathing and compliance to a pre/post walking stretching routine.  Principles of exercise were provided to help pt understand how to return to fitness with less leakage. Pt voiced understanding.  Plan to progress to endurance stregntehning at next session if pt presents with overall mm tensions. Pt continues to benefit from skilled PT.    Rehab Potential Good   PT Frequency 2x / week   PT Duration 12 weeks   PT Treatment/Interventions Neuromuscular re-education;Therapeutic exercise;Therapeutic activities;Moist Heat;Energy conservation;Patient/family education;Scar mobilization;Gait training;Stair  training;Functional mobility training   Consulted and Agree with Plan of Care Patient      Patient will benefit from skilled therapeutic intervention in order to improve the following deficits and impairments:  Decreased coordination, Decreased safety awareness, Decreased strength, Decreased mobility, Decreased endurance, Hypomobility, Decreased range of motion, Postural dysfunction, Pain  Visit Diagnosis: Other lack of coordination  Muscle weakness (generalized)  Myalgia     Problem List Patient Active  Problem List   Diagnosis Date Noted  . Prostate cancer ([place]) 02/24/2016  . Urinary urgency 02/24/2016  . Chest pain 01/24/2016  . NSTEMI (non-ST elevated myocardial infarction) ([place]) 01/24/2016  . Elevated PSA 12/30/2015  . Urinary frequency 12/30/2015  . BPH (benign prostatic hyperplasia) 11/27/2015  . Dyslipidemia 11/27/2015  . Essential hypertension 11/27/2015  . Hyperlipidemia 11/27/2015  . Hypertension 11/27/2015  . Snoring 11/27/2015  . Colon adenoma 01/21/2012    Jerl Mina ,PT, DPT, E-RYT  05/27/2016, 4:27 PM  [place] MAIN [facility] SERVICES [address] [place], [place], [address] Phone: [phone]   Fax:  [phone]  Name: Garrett Watkins MRN: [id number] Date of Birth: [birth year]

## 2016-05-27 NOTE — Patient Instructions (Signed)
Walking on treadmill: No ramps/ incline yet  15 min warm up  20 min  15 min cool up  ___________   Stretches: pre and post walking:  3-5 breaths each   Hold the wide leg fold- touching floor   Instead:  1) Hip flexor ( feet hip width apart), R leg back, L leg forward,  reach up with R hand on the same side as back leg   2) Hip  Flexor twist,  R leg back, L leg forward, R hand on L thigh, L hand on L hip   3) Quad stretch  4) Calf stretch  Knee bend at the back leg    5) Hand on tree wall Standing figure -4 (mini squat)     _________  Add to deep core level 1 ( breathing)  Quick contraction for 10 reps

## 2016-06-02 ENCOUNTER — Ambulatory Visit (INDEPENDENT_AMBULATORY_CARE_PROVIDER_SITE_OTHER): Payer: BLUE CROSS/BLUE SHIELD

## 2016-06-02 VITALS — BP 176/107 | HR 78 | Ht 73.0 in | Wt 224.3 lb

## 2016-06-02 DIAGNOSIS — R300 Dysuria: Secondary | ICD-10-CM | POA: Diagnosis not present

## 2016-06-02 LAB — URINALYSIS, COMPLETE
Bilirubin, UA: NEGATIVE
GLUCOSE, UA: NEGATIVE
KETONES UA: NEGATIVE
Leukocytes, UA: NEGATIVE
Nitrite, UA: NEGATIVE
PROTEIN UA: NEGATIVE
RBC, UA: NEGATIVE
SPEC GRAV UA: 1.025 (ref 1.005–1.030)
UUROB: 0.2 mg/dL (ref 0.2–1.0)
pH, UA: 5 (ref 5.0–7.5)

## 2016-06-02 NOTE — Progress Notes (Signed)
Pt presents today with c/o urinary frequency, dysuria, and leakage of urine. Pt has been on abx in the last 30 days for a UTI and has had a prostatectomy in the last 30 days. A clean catch was obtained for u/a and cx.   Blood pressure (!) 176/107, pulse 78, height 6\' 1"  (1.854 m), weight 224 lb 4.8 oz (101.7 kg).

## 2016-06-03 ENCOUNTER — Telehealth: Payer: Self-pay

## 2016-06-03 ENCOUNTER — Ambulatory Visit: Payer: BLUE CROSS/BLUE SHIELD | Admitting: Physical Therapy

## 2016-06-03 DIAGNOSIS — R278 Other lack of coordination: Secondary | ICD-10-CM | POA: Diagnosis not present

## 2016-06-03 DIAGNOSIS — M6281 Muscle weakness (generalized): Secondary | ICD-10-CM | POA: Diagnosis not present

## 2016-06-03 DIAGNOSIS — M791 Myalgia, unspecified site: Secondary | ICD-10-CM

## 2016-06-03 NOTE — Telephone Encounter (Signed)
Spoke with pt in reference to -u/a. Pt voiced understanding. Pt then stated that he was given a condom catheter by Dr. Junious Silk previously. Pt stated that condom catheter does not work for him. Pt requested a penis clamp. Please advise.

## 2016-06-03 NOTE — Telephone Encounter (Signed)
Penis clamp ordered through [facility].

## 2016-06-03 NOTE — Telephone Encounter (Signed)
Roselle Locus, MD

## 2016-06-03 NOTE — Telephone Encounter (Signed)
-----   Message from Hollice Espy, MD sent at 06/02/2016  4:48 PM EDT ----- Please that this patient know that his urine was completely negative. No signs of infection.  Hollice Espy, MD

## 2016-06-03 NOTE — Therapy (Signed)
[place] MAIN [facility] SERVICES [address] [place], [place], [address] Phone: [phone]   Fax:  [phone]  Physical Therapy Treatment  Patient Details  Name: Garrett Watkins MRN: [id number] Date of Birth: 26-Jun-1960 Referring Provider: Tresa Moore  Encounter Date: 06/03/2016      PT End of Session - 06/03/16 1442    Visit Number 4   Number of Visits 12   PT Start Time 1010   PT Stop Time 1110   PT Time Calculation (min) 60 min   Activity Tolerance Patient tolerated treatment well;No increased pain   Behavior During Therapy WFL for tasks assessed/performed      Past Medical History:  Diagnosis Date  . BPH (benign prostatic hyperplasia)   . Colon adenoma   . Dyslipidemia   . GERD (gastroesophageal reflux disease)    history of  . Hypertension   . OSA (obstructive sleep apnea)   . Prostate cancer [facility])     Past Surgical History:  Procedure Laterality Date  . CARDIAC CATHETERIZATION Right 01/24/2016   Procedure: Left Heart Cath and Coronary Angiography;  Surgeon: Corey Skains, MD;  Location: [place] [facility];  Service: Cardiovascular;  Laterality: Right;  . CYST EXCISION    . LYMPHADENECTOMY Bilateral 04/21/2016   Procedure: PELVIC LYMPHADENECTOMY;  Surgeon: Alexis Frock, MD;  Location: [facility] ORS;  Service: Urology;  Laterality: Bilateral;  . NO PAST SURGERIES    . ROBOT ASSISTED LAPAROSCOPIC RADICAL PROSTATECTOMY N/A 04/21/2016   Procedure: ROBOTIC ASSISTED LAPAROSCOPIC RADICAL PROSTATECTOMY;  Surgeon: Alexis Frock, MD;  Location: [facility] ORS;  Service: Urology;  Laterality: N/A;  . VASECTOMY      There were no vitals filed for this visit.      Subjective Assessment - 06/03/16 1430    Subjective Pt reported he still gets up 6 x/ night because he worries he still has to empty his bladder. The pain near his rectum has gotten better. He does feel a shooting pain inside the penis midstream when urinating. Pt is able to increase  his walking and has noticed less leakage.    Pertinent History Prostate CA, physical active, enjoys working out includes sit ups and crunches   Patient Stated Goals be normal again, go to the bathroom and be able to hold his urine before, sleep better with less interrupted trips to the urination                       Pelvic Floor Special Questions - 06/03/16 1432    External Perineal Exam clothing doffed  with consent    External Palpation increased tensions at ischiocavernosus, suprapubic, over pubic tubrcle on R, reproduction of shooting pain that he gets when urinating (Post Tx: decreased tenderness and tensions and no reproduction of pain with palpation)            [facility] Adult PT Treatment/Exercise - 06/03/16 1433      Therapeutic Activites    Therapeutic Activities --  see pt instructions:gait training:resistance at waist 10'x4      Neuro Re-ed    Neuro Re-ed Details  see pt instructions     Manual Therapy   Manual therapy comments external fascial releases over problem areas indicated in assessment. combined with MWM hip abd/ER/flex/ext    supra pubic massage (triangle pattern to faciliate fascial mobility)                PT Education - 06/03/16 1441    Education  provided Yes   Education Details HEP, explanation with images and pictures about pudendal nerve pathway for cause of shooting pain (doral penile nn), retraining bladder/brain neural loop    Person(s) Educated Patient   Methods Explanation;Demonstration;Tactile cues;Verbal cues;Handout   Comprehension Verbalized understanding;Returned demonstration;Verbal cues required;Tactile cues required             PT Long Term Goals - 06/03/16 1447      PT LONG TERM GOAL #1   Title Pt will decrease getting up at night from 9-10 x/ night to < 4x/ night in order to improve sleep. Pt will also use his CPAP machine nighlt  (5/23: 6x/ night)    Time 12   Period Weeks   Status Partially Met     PT  LONG TERM GOAL #2   Title Pt will report decreased pain from 7/10 to < 2/10 at the end of uriation across 5 episodes    Time 8   Period Weeks   Status Partially Met     PT LONG TERM GOAL #3   Title Pt will report being able to sit for > 30 min  in order to sit through community events    Baseline 10 min before pain onset    Time 8   Period Weeks   Status Achieved     PT LONG TERM GOAL #4   Title Pt will report being able to sense the urge to urinate instead of the pain signal as the urge to urinate in order to sleep    Time 12   Period Weeks   Status Partially Met     PT LONG TERM GOAL #5   Title Pt will decrease his IPPS score from 35 pts severe to < 19 pts ( moderate) and 6 pts in QOL score to < 3 pts in order to improve function and QOL   Time 12   Period Weeks   Status Partially Met               Plan - 06/03/16 1443    Clinical Impression Statement Pt continues to have improvement with less leakage with increased walking. Pt's rectal pain has resolved and he is able to sit without pain.  Addressed today the remaining penile pain that he has midstream with urination. Palpation over R suprapubic area/pubic tubercle reproduced the pain. Following manual Tx, pain was not reproduced with palpation and R supra pubic area and ischiocavernosus mm were decreased in fascial retrictions. Implemented bladder behavior education to decrease getting up multiple times a night with explanations of anatomy and physiology.  Pt demo'd more understanding about the source of his pain. Pt continues to progress well with skilled PT.     Rehab Potential Good   PT Frequency 2x / week   PT Duration 12 weeks   PT Treatment/Interventions Neuromuscular re-education;Therapeutic exercise;Therapeutic activities;Moist Heat;Energy conservation;Patient/family education;Scar mobilization;Gait training;Stair training;Functional mobility training   Consulted and Agree with Plan of Care Patient      Patient  will benefit from skilled therapeutic intervention in order to improve the following deficits and impairments:  Decreased coordination, Decreased safety awareness, Decreased strength, Decreased mobility, Decreased endurance, Hypomobility, Decreased range of motion, Postural dysfunction, Pain  Visit Diagnosis: Other lack of coordination  Muscle weakness (generalized)  Myalgia     Problem List Patient Active Problem List   Diagnosis Date Noted  . Prostate cancer ([place]) 02/24/2016  . Urinary urgency 02/24/2016  . Chest pain 01/24/2016  . NSTEMI (non-ST  elevated myocardial infarction) ([place]) 01/24/2016  . Elevated PSA 12/30/2015  . Urinary frequency 12/30/2015  . BPH (benign prostatic hyperplasia) 11/27/2015  . Dyslipidemia 11/27/2015  . Essential hypertension 11/27/2015  . Hyperlipidemia 11/27/2015  . Hypertension 11/27/2015  . Snoring 11/27/2015  . Colon adenoma 01/21/2012    Jerl Mina ,PT, DPT, E-RYT  06/03/2016, 2:48 PM  [place] MAIN [facility] SERVICES [address] [place], [place], [address] Phone: [phone]   Fax:  [phone]  Name: Garrett Watkins MRN: [id number] Date of Birth: 1960-11-20

## 2016-06-03 NOTE — Patient Instructions (Signed)
To minimize toileting trips at night:  Finish last glass of water 2 hours before bed Empty on first urge to go Second urge: Apply these techniques to relax and retrain the brain and bladder loop:  Low abdominal massage: =3strokes from pubic symphysis, 3 strokes inguinal crease L and R each side = 9 total    Leg movements: Toe in and out, knee rocking in and out Heel slides     Body scan    __________  Increase one more 16 fl oz water to 4 total / day   __________ Continue with stretches with walking

## 2016-06-04 NOTE — Telephone Encounter (Signed)
Per Dr. Erlene Quan pt should have a f/u with Dr. Tresa Moore. Spoke with pt and made aware penis clamp orders have been faxed and should have an appt with Dr. Tresa Moore. Pt stated that he went to see PT yesterday and made therapist aware of burning sensation. Pt then stated that PT was able to recreate the pain and therefore they are working on a plan. At this time pt declined an appt with Dr. Tresa Moore. Reinforced with pt should he change his mind to call back for an appt. Pt voiced understanding.

## 2016-06-05 LAB — CULTURE, URINE COMPREHENSIVE

## 2016-06-10 ENCOUNTER — Ambulatory Visit: Payer: BLUE CROSS/BLUE SHIELD | Admitting: Physical Therapy

## 2016-06-10 DIAGNOSIS — R278 Other lack of coordination: Secondary | ICD-10-CM

## 2016-06-10 DIAGNOSIS — M791 Myalgia, unspecified site: Secondary | ICD-10-CM

## 2016-06-10 DIAGNOSIS — M6281 Muscle weakness (generalized): Secondary | ICD-10-CM

## 2016-06-10 NOTE — Patient Instructions (Signed)
Pelvic tilts  (anterior tilt to relax muscles and maintain spinal curves)   3- way hip extensions  Practice stretches from last session's home program

## 2016-06-10 NOTE — Therapy (Signed)
[place] MAIN [facility] SERVICES [address] [place], [place], [address] Phone: [phone]   Fax:  [phone]  Physical Therapy Treatment  Patient Details  Name: Garrett Watkins MRN: [id number] Date of Birth: 06/18/1960 Referring Provider: Tresa Moore  Encounter Date: 06/10/2016      PT End of Session - 06/10/16 1520    Visit Number 4   Number of Visits 12   PT Start Time 3888   PT Stop Time 2800   PT Time Calculation (min) 56 min   Activity Tolerance Patient tolerated treatment well;No increased pain   Behavior During Therapy WFL for tasks assessed/performed      Past Medical History:  Diagnosis Date  . BPH (benign prostatic hyperplasia)   . Colon adenoma   . Dyslipidemia   . GERD (gastroesophageal reflux disease)    history of  . Hypertension   . OSA (obstructive sleep apnea)   . Prostate cancer [facility])     Past Surgical History:  Procedure Laterality Date  . CARDIAC CATHETERIZATION Right 01/24/2016   Procedure: Left Heart Cath and Coronary Angiography;  Surgeon: Corey Skains, MD;  Location: [place] [facility];  Service: Cardiovascular;  Laterality: Right;  . CYST EXCISION    . LYMPHADENECTOMY Bilateral 04/21/2016   Procedure: PELVIC LYMPHADENECTOMY;  Surgeon: Alexis Frock, MD;  Location: [facility];  Service: Urology;  Laterality: Bilateral;  . NO PAST SURGERIES    . ROBOT ASSISTED LAPAROSCOPIC RADICAL PROSTATECTOMY N/A 04/21/2016   Procedure: ROBOTIC ASSISTED LAPAROSCOPIC RADICAL PROSTATECTOMY;  Surgeon: Alexis Frock, MD;  Location: [facility] OR[facility];  Service: Urology;  Laterality: N/A;  . VASECTOMY      There were no vitals filed for this visit.      Subjective Assessment - 06/10/16 1427    Subjective Pt reported he urinates every half hour at least and and still he has not completely emptied in some cases. When he goes to sleep at night, pt gets up twice between 11 and 12pm but does not empty the first time. He sleeps for 3 hours  which is longer than before.  At 2pm , pt has the urge to urinate again and he does empty completely. Then at [place], pt gets up again with more complete emptying.  Pt feels the only thing that is annoying is the sensitivity he has on the tip of his penis which has been present since surgery. This sensitivity occurs with touch of fabric and plastics from pads or if the underwear is tight. It occurs after he urinates 100% of the time.  This sensitivity dissipates after 30 min when laying down but with 5 min after moving around.  The pt no longer has the frontal pain has resolved since last session and the rectal pain has resolved with rare instances.          Pertinent History Prostate CA, physical active, enjoys working out includes sit ups and crunches   Patient Stated Goals be normal again, go to the bathroom and be able to hold his urine before, sleep better with less interrupted trips to the urination             [facility] PT Assessment - 06/10/16 1642      Observation/Other Assessments   Observations crossing L ankle over R thigh with difficulty, less mobility at hips > R.  Educated to raise seat higher to maintain spinal curves       Palpation   SI assessment  L PSIS more posterior/  increased hypomobility                   Pelvic Floor Special Questions - 06/10/16 1642    External Perineal Exam clothing doffed  with consent            [place] Adult PT Treatment/Exercise - 06/10/16 1634      Therapeutic Activites    Therapeutic Activities --  3-way hip extensions, HEP from last session for stretches      Neuro Re-ed    Neuro Re-ed Details  pelvic tilt and relationship to pelvic floor mm length tension       Manual Therapy   Manual therapy comments long axis distraction on BLE,  rotational mob of L iliac crest over sacrum, inferior/superior mob os sacrum and PA mob at lateral border of sacrum. ( elictied smoother movement of hip flex/abd/ER in seated position post Tx)      Other Manual Therapy STM with MWM at R ischiocavernosus, proximal attachment of bulbospongiosus, deep transverse perineal, distal aspect of of pubic tubercle ( no reproduction of penile pain post Tx)                  PT Education - 06/10/16 1644    Education provided Yes   Education Details HEP, anatomy with images explained about pudendal nn ( 3 branches from  sciatic nn). Stretches HEP to relax mm to increase complete emptying fof urine, sitting posture, pelvic tilts for length tension relationship of pelvic floor mm    Person(s) Educated Patient   Methods Explanation;Demonstration;Tactile cues;Verbal cues   Comprehension Verbalized understanding;Returned demonstration             PT Long Term Goals - 06/03/16 1447      PT LONG TERM GOAL #1   Title Pt will decrease getting up at night from 9-10 x/ night to < 4x/ night in order to improve sleep. Pt will also use his CPAP machine nighlt  (5/23: 6x/ night)    Time 12   Period Weeks   Status Partially Met     PT LONG TERM GOAL #2   Title Pt will report decreased pain from 7/10 to < 2/10 at the end of uriation across 5 episodes    Time 8   Period Weeks   Status Partially Met     PT LONG TERM GOAL #3   Title Pt will report being able to sit for > 30 min  in order to sit through community events    Baseline 10 min before pain onset    Time 8   Period Weeks   Status Achieved     PT LONG TERM GOAL #4   Title Pt will report being able to sense the urge to urinate instead of the pain signal as the urge to urinate in order to sleep    Time 12   Period Weeks   Status Partially Met     PT LONG TERM GOAL #5   Title Pt will decrease his IPPS score from 35 pts severe to < 19 pts ( moderate) and 6 pts in QOL score to < 3 pts in order to improve function and QOL   Time 12   Period Weeks   Status Partially Met               Plan - 06/10/16 1646    Clinical Impression Statement Pt   Rehab Potential Good   PT  Frequency 2x / week  PT Duration 12 weeks   PT Treatment/Interventions Neuromuscular re-education;Therapeutic exercise;Therapeutic activities;Moist Heat;Energy conservation;Patient/family education;Scar mobilization;Gait training;Stair training;Functional mobility training   Consulted and Agree with Plan of Care Patient      Patient will benefit from skilled therapeutic intervention in order to improve the following deficits and impairments:  Decreased coordination, Decreased safety awareness, Decreased strength, Decreased mobility, Decreased endurance, Hypomobility, Decreased range of motion, Postural dysfunction, Pain  Visit Diagnosis: Other lack of coordination  Muscle weakness (generalized)  Myalgia     Problem List Patient Active Problem List   Diagnosis Date Noted  . Prostate cancer ([place]) 02/24/2016  . Urinary urgency 02/24/2016  . Chest pain 01/24/2016  . NSTEMI (non-ST elevated myocardial infarction) ([place]) 01/24/2016  . Elevated PSA 12/30/2015  . Urinary frequency 12/30/2015  . BPH (benign prostatic hyperplasia) 11/27/2015  . Dyslipidemia 11/27/2015  . Essential hypertension 11/27/2015  . Hyperlipidemia 11/27/2015  . Hypertension 11/27/2015  . Snoring 11/27/2015  . Colon adenoma 01/21/2012    Jerl Mina ,PT, DPT, E-RYT  06/10/2016, 4:47 PM  [place] MAIN [facility] SERVICES [address] [place], [place], [address] Phone: [phone]   Fax:  [phone]  Name: Ramiel Forti MRN: [id number] Date of Birth: 07-03-1960

## 2016-06-17 ENCOUNTER — Ambulatory Visit: Payer: BLUE CROSS/BLUE SHIELD | Attending: Urology | Admitting: Physical Therapy

## 2016-06-17 DIAGNOSIS — M6281 Muscle weakness (generalized): Secondary | ICD-10-CM | POA: Diagnosis not present

## 2016-06-17 DIAGNOSIS — R278 Other lack of coordination: Secondary | ICD-10-CM | POA: Insufficient documentation

## 2016-06-17 DIAGNOSIS — M791 Myalgia, unspecified site: Secondary | ICD-10-CM

## 2016-06-18 NOTE — Therapy (Signed)
[place] MAIN [facility] SERVICES [address] [place], [place], [address] Phone: [phone]   Fax:  [phone]  Physical Therapy Treatment  Patient Details  Name: Garrett Watkins MRN: [id number] Date of Birth: 05-25-1960 Referring Provider: Tresa Moore  Encounter Date: 06/17/2016      PT End of Session - 06/18/16 1310    Visit Number 5   Number of Visits 12   PT Start Time 8366   PT Stop Time 1600   PT Time Calculation (min) 53 min   Activity Tolerance Patient tolerated treatment well;No increased pain   Behavior During Therapy WFL for tasks assessed/performed      Past Medical History:  Diagnosis Date  . BPH (benign prostatic hyperplasia)   . Colon adenoma   . Dyslipidemia   . GERD (gastroesophageal reflux disease)    history of  . Hypertension   . OSA (obstructive sleep apnea)   . Prostate cancer [facility])     Past Surgical History:  Procedure Laterality Date  . CARDIAC CATHETERIZATION Right 01/24/2016   Procedure: Left Heart Cath and Coronary Angiography;  Surgeon: Corey Skains, MD;  Location: [place] [facility];  Service: Cardiovascular;  Laterality: Right;  . CYST EXCISION    . LYMPHADENECTOMY Bilateral 04/21/2016   Procedure: PELVIC LYMPHADENECTOMY;  Surgeon: Alexis Frock, MD;  Location: [facility];  Service: Urology;  Laterality: Bilateral;  . NO PAST SURGERIES    . ROBOT ASSISTED LAPAROSCOPIC RADICAL PROSTATECTOMY N/A 04/21/2016   Procedure: ROBOTIC ASSISTED LAPAROSCOPIC RADICAL PROSTATECTOMY;  Surgeon: Alexis Frock, MD;  Location: [facility];  Service: Urology;  Laterality: N/A;  . VASECTOMY      There were no vitals filed for this visit.      Subjective Assessment - 06/17/16 1417    Subjective Pt reports that he is not dripping as much. Pt is having 40% less penile pain and he states it seems it is going away . When he has the urge to urinate, he still has the pain that feels deep in the bladder area (2/10)  but he is  starting to feel the normal sensation of fullness.  Pt reports his sleep is getting better and getting up 4 x/ night instead of 8-10x/ night. Pt has one night where he sleep 4 continuous hours and had been able to delay urination for 3.5 hours of a car ride with completion emptying when he voided.      Pertinent History Prostate CA, physical active, enjoys working out includes sit ups and crunches   Patient Stated Goals be normal again, go to the bathroom and be able to hold his urine before, sleep better with less interrupted trips to the urination             [facility] PT Assessment - 06/18/16 1306      Palpation   SI assessment  increased mobility at L PSIS, sacral nutanation/ counternutation limited                  Pelvic Floor Special Questions - 06/18/16 1303    External Perineal Exam performed with clothing doffed; verbal consent provided   External Palpation increased tensions lateral border of bladder over suprapubic area on R, increased tensions/ tenderness along bulbospongious on R (decreased post Tx)            [facility] Adult PT Treatment/Exercise - 06/18/16 1305      Therapeutic Activites    Therapeutic Activities --  see pt instruction  Manual Therapy   Manual therapy comments long axis distraction on BLE,  rotational mob of L iliac crest over sacrum, inferior/superior mob os sacrum and PA mob at lateral border of sacrum. ( elictied smoother movement of hip flex/abd/ER in seated position post Tx)     Other Manual Therapy Fascial releases at problem areas noted in assessment, self-massage with sustained pressure with hip IR/ER at suprapubic area, STM/ MWM fascial releases with coordinated breathing at bulbospongiosus                       PT Long Term Goals - 06/18/16 1314      PT LONG TERM GOAL #1   Title Pt will decrease getting up at night from 9-10 x/ night to < 4x/ night in order to improve sleep. Pt will also use his CPAP machine nighlt   (5/23: 6x/ night)    Time 12   Period Weeks   Status Partially Met     PT LONG TERM GOAL #2   Title Pt will report decreased pain from 7/10 to < 2/10 at the end of uriation across 5 episodes    Time 8   Period Weeks   Status Partially Met     PT LONG TERM GOAL #3   Title Pt will report being able to sit for > 30 min  in order to sit through community events    Baseline 10 min before pain onset    Time 8   Period Weeks   Status Achieved     PT LONG TERM GOAL #4   Title Pt will report being able to sense the urge to urinate instead of the pain signal as the urge to urinate in order to sleep    Time 12   Period Weeks   Status Partially Met     PT LONG TERM GOAL #5   Title Pt will decrease his IPPS score from 35 pts severe to < 19 pts ( moderate) and 6 pts in QOL score to < 3 pts in order to improve function and QOL   Time 12   Period Weeks   Status Partially Met     Additional Long Term Goals   Additional Long Term Goals Yes     PT LONG TERM GOAL #6   Title Pt will demo improved arthrokinematics mobility at SIJ B across 2 visits in order to progress to upright loaded deep core / thoracolumbar strengthening to not leak with pertubation movements in ADLs   Time 12   Period Weeks   Status New               Plan - 06/18/16 1310    Clinical Impression Statement Pt is making progress with report of 40% decrease in penile pain and getting better sleep with nocturia 4x/ night instead of 8x/night.  Continued to address pelvic obliquity today. Showed good carry-over from last session but will require more Tx to gain more mobility L SIJ and R pelvic floor tensions.  Addressed bladder pain that triggers his urge by decreasing fascial tensions over suprapubic area. Added self-massage to HEP. Pt continues to benefit from skilled PT.     Rehab Potential Good   PT Frequency 2x / week   PT Duration 12 weeks   PT Treatment/Interventions Neuromuscular re-education;Therapeutic  exercise;Therapeutic activities;Moist Heat;Energy conservation;Patient/family education;Scar mobilization;Gait training;Stair training;Functional mobility training   Consulted and Agree with Plan of Care Patient      Patient  will benefit from skilled therapeutic intervention in order to improve the following deficits and impairments:  Decreased coordination, Decreased safety awareness, Decreased strength, Decreased mobility, Decreased endurance, Hypomobility, Decreased range of motion, Postural dysfunction, Pain  Visit Diagnosis: Other lack of coordination  Myalgia  Muscle weakness (generalized)     Problem List Patient Active Problem List   Diagnosis Date Noted  . Prostate cancer ([place]) 02/24/2016  . Urinary urgency 02/24/2016  . Chest pain 01/24/2016  . NSTEMI (non-ST elevated myocardial infarction) ([place]) 01/24/2016  . Elevated PSA 12/30/2015  . Urinary frequency 12/30/2015  . BPH (benign prostatic hyperplasia) 11/27/2015  . Dyslipidemia 11/27/2015  . Essential hypertension 11/27/2015  . Hyperlipidemia 11/27/2015  . Hypertension 11/27/2015  . Snoring 11/27/2015  . Colon adenoma 01/21/2012    Jerl Mina ,PT, DPT, E-RYT  06/18/2016, 1:16 PM  [place] MAIN [facility] SERVICES [address] [place], [place], [address] Phone: [phone]   Fax:  [phone]  Name: Garrett Watkins MRN: [id number] Date of Birth: Nov 14, 1960

## 2016-06-18 NOTE — Patient Instructions (Signed)
Light sustained pressure above pubic bone coordinated with toes in and out (laying down) to stretch the area Add to abdominal massage   Self-massage (light pressure, fascial release)  Base of scrotum, along deep transverse perineal and bulbospongiosus  5 reps zigzag towards perineum

## 2016-06-24 ENCOUNTER — Ambulatory Visit: Payer: BLUE CROSS/BLUE SHIELD | Admitting: Physical Therapy

## 2016-06-24 DIAGNOSIS — M791 Myalgia, unspecified site: Secondary | ICD-10-CM

## 2016-06-24 DIAGNOSIS — R278 Other lack of coordination: Secondary | ICD-10-CM

## 2016-06-24 DIAGNOSIS — M6281 Muscle weakness (generalized): Secondary | ICD-10-CM | POA: Diagnosis not present

## 2016-06-24 NOTE — Therapy (Addendum)
[place] MAIN [facility] SERVICES [address] [place], [place], [address] Phone: [phone]   Fax:  [phone]  Physical Therapy Treatment / Progress Note   Patient Details  Name: Garrett Watkins MRN: [id number] Date of Birth: 04-Jan-1961 Referring Provider: Tresa Moore  Encounter Date: 06/24/2016      PT End of Session - 06/24/16 1121    Visit Number 6   Number of Visits 12   PT Start Time 0814   PT Stop Time 0915   PT Time Calculation (min) 61 min   Activity Tolerance Patient tolerated treatment well;No increased pain   Behavior During Therapy WFL for tasks assessed/performed      Past Medical History:  Diagnosis Date  . BPH (benign prostatic hyperplasia)   . Colon adenoma   . Dyslipidemia   . GERD (gastroesophageal reflux disease)    history of  . Hypertension   . OSA (obstructive sleep apnea)   . Prostate cancer [facility])     Past Surgical History:  Procedure Laterality Date  . CARDIAC CATHETERIZATION Right 01/24/2016   Procedure: Left Heart Cath and Coronary Angiography;  Surgeon: Corey Skains, MD;  Location: [place] [facility];  Service: Cardiovascular;  Laterality: Right;  . CYST EXCISION    . LYMPHADENECTOMY Bilateral 04/21/2016   Procedure: PELVIC LYMPHADENECTOMY;  Surgeon: Alexis Frock, MD;  Location: [facility];  Service: Urology;  Laterality: Bilateral;  . NO PAST SURGERIES    . ROBOT ASSISTED LAPAROSCOPIC RADICAL PROSTATECTOMY N/A 04/21/2016   Procedure: ROBOTIC ASSISTED LAPAROSCOPIC RADICAL PROSTATECTOMY;  Surgeon: Alexis Frock, MD;  Location: [facility];  Service: Urology;  Laterality: N/A;  . VASECTOMY      There were no vitals filed for this visit.      Subjective Assessment - 06/24/16 0817    Subjective When he was tired from driving on his out of town trip and slept 4 hours uninterrupted. At home, pt sleeps 2 hours increments instead of every hour. He feels it is getting better. Pt feels he lack of sleep has to do  with psychological causes because he has an expensive bed and don't want to ruin it, he does not want to be seen as weak.  He was able to relax more at the hotel bed because he was physical and mentially tired. Pt has started to wean himself off the dipaers at night 3 weeks ago. He has not needed it for the 1st couple of hours of sleep. After those hours, he felt confident he is able to get up. As time goes now, pt now feels less pain as the trigger to go to the batthroom. He is feeling the fullness of his bladder return to make him be aware it is time to get up to urinate.  He was in his office yesterday in the meeting and he felt the fullness of his bladder after delaying urination for 45 min due to being in a meeting. Pt did not feel as much pain in the past and it feels more uncomfortable than it is pain. Pt feels an improvement knowing that he is not going to urinate based on pain serving as the trigger. Last week was a better week for control. Pt has decreased to one pad wear.      Pertinent History Prostate CA, physical active, enjoys working out includes sit ups and crunches   Patient Stated Goals be normal again, go to the bathroom and be able to hold his urine before, sleep better  with less interrupted trips to the urination               PSIS more symmetrically aligned with L SIJ mobility.         Pelvic Floor Special Questions - 06/24/16 1131    External Perineal Exam performed with clothing donned    External Palpation minimal tensions/ tenderness of pelvic floor mm and suprapubic area            [facility] Adult PT Treatment/Exercise - 06/24/16 1131      Therapeutic Activites    Therapeutic Activities --  see pt education section                 PT Education - 06/24/16 1120    Education provided Yes   Education Details HEP, education on sexual function, physiology, strategies to advance towards sleeping goal and to overcome mental blocks with sleeping in  expensive bed   Person(s) Educated Patient   Methods Explanation;Demonstration;Tactile cues;Verbal cues   Comprehension Returned demonstration;Verbalized understanding             PT Long Term Goals - 06/18/16 1314      PT LONG TERM GOAL #1   Title Pt will decrease getting up at night from 9-10 x/ night to < 4x/ night in order to improve sleep. Pt will also use his CPAP machine nighlt  (5/23: 6x/ night)    Time 12   Period Weeks   Status Partially Met     PT LONG TERM GOAL #2   Title Pt will report decreased pain from 7/10 to < 2/10 at the end of uriation across 5 episodes    Time 8   Period Weeks   Status Partially Met     PT LONG TERM GOAL #3   Title Pt will report being able to sit for > 30 min  in order to sit through community events    Baseline 10 min before pain onset    Time 8   Period Weeks   Status Achieved     PT LONG TERM GOAL #4   Title Pt will report being able to sense the urge to urinate instead of the pain signal as the urge to urinate in order to sleep    Time 12   Period Weeks   Status Partially Met     PT LONG TERM GOAL #5   Title Pt will decrease his IPPS score from 35 pts severe to < 19 pts ( moderate) and 6 pts in QOL score to < 3 pts in order to improve function and QOL   Time 12   Period Weeks   Status Partially Met     Additional Long Term Goals   Additional Long Term Goals Yes     PT LONG TERM GOAL #6   Title Pt will demo improved arthrokinematics mobility at SIJ B across 2 visits in order to progress to upright loaded deep core / thoracolumbar strengthening to not leak with pertubation movements in ADLs   Time 12   Period Weeks   Status New               Plan - 06/24/16 1121    Clinical Impression Statement Pt reports he is feeling more fullness of his bladder and able to delay urination at a business meeting.Pt is feeling less pain and getting the urge to urinate based on the sensation of a full bladder rather than being  triggered by pain. Perineal  pain, pelvic floor mm tenderness/tensions, pelvic obliquties have decreased significantly.  Pt has been able to have 4 hours of continuous sleep when sleeping at a hotel bed. Pt acknowledges what his barriers are related to sleeping in his own bed.  Pt  required motivational interviewing to strategize ways to improve sleeping in his own bed and to build his confidence with his new post-surgery deficits.  Provided education for the majority of his visit towards his question about impotence. Anatomy and physiology education w/ images and book resource was provided. With more resolved pain, increased hip mobility and no pelvic obliquities, pt will be ready for return to fitness phase. Pt feels getting back to his weight training and fitness workout routine will help his self-esteem. Pt plans to consult his MD when he will be cleared to return to weight training. PT explained he will need to contiue performing his deep core strengthening exercises correctly first prior to adding weights to routine in order to minimize relapse of pelvic / urinary Sx. Pt voiced understanding.  Pt continues to benefit from skilled PT.      Rehab Potential Good   PT Frequency 2x / week   PT Duration 12 weeks   PT Treatment/Interventions Neuromuscular re-education;Therapeutic exercise;Therapeutic activities;Moist Heat;Energy conservation;Patient/family education;Scar mobilization;Gait training;Stair training;Functional mobility training   Consulted and Agree with Plan of Care Patient      Patient will benefit from skilled therapeutic intervention in order to improve the following deficits and impairments:  Decreased coordination, Decreased safety awareness, Decreased strength, Decreased mobility, Decreased endurance, Hypomobility, Decreased range of motion, Postural dysfunction, Pain  Visit Diagnosis: Other lack of coordination  Myalgia  Muscle weakness (generalized)     Problem List Patient  Active Problem List   Diagnosis Date Noted  . Prostate cancer ([place]) 02/24/2016  . Urinary urgency 02/24/2016  . Chest pain 01/24/2016  . NSTEMI (non-ST elevated myocardial infarction) ([place]) 01/24/2016  . Elevated PSA 12/30/2015  . Urinary frequency 12/30/2015  . BPH (benign prostatic hyperplasia) 11/27/2015  . Dyslipidemia 11/27/2015  . Essential hypertension 11/27/2015  . Hyperlipidemia 11/27/2015  . Hypertension 11/27/2015  . Snoring 11/27/2015  . Colon adenoma 01/21/2012    Jerl Mina ,PT, DPT, E-RYT  06/24/2016, 11:32 AM  [place] MAIN [facility] SERVICES [address] [place], [place], [address] Phone: [phone]   Fax:  [phone]  Name: Garrett Watkins MRN: [id number] Date of Birth: Aug 08, 1960

## 2016-06-24 NOTE — Patient Instructions (Signed)
Deep core level  2  6 min in morning and evening  6 min seated mid day   Proper sequence with inahale, exhale then move knee without movement of lumbar   _____  Self massage from last HEP   _____ Sleeping and confidence building strategy  Sleep in guest bed room temporarily and then back to new mattress

## 2016-06-25 ENCOUNTER — Telehealth: Payer: Self-pay | Admitting: Urology

## 2016-06-25 NOTE — Telephone Encounter (Signed)
Correspondence received from [employer], PT with the following note:  "Patient is making excellent progress related to pelvic pain and urinary symptoms.  Please see progress note. Quick question for you, when will patient be allowed to return to weight lifting? I will guide him gradually with deep core mm system coordination to minimize leakage when he has been cleared."

## 2016-07-08 ENCOUNTER — Ambulatory Visit: Payer: BLUE CROSS/BLUE SHIELD | Admitting: Physical Therapy

## 2016-07-08 DIAGNOSIS — R278 Other lack of coordination: Secondary | ICD-10-CM | POA: Diagnosis not present

## 2016-07-08 DIAGNOSIS — M791 Myalgia, unspecified site: Secondary | ICD-10-CM

## 2016-07-08 DIAGNOSIS — M6281 Muscle weakness (generalized): Secondary | ICD-10-CM

## 2016-07-08 NOTE — Patient Instructions (Addendum)
Proper coordination with lifting    (handout )    Standing PNF band exercises   "Drawing a sword" " Pulling a lawnmover"  Band is over the edge of the door, knot is on the other side of the door,  R hand holds end of the band by L ear level   Press downward into the ballmounds and heels of the feet, thigh muscle active, Keep knees and hips squared the front and do not move them throughout the activity, Exhale to pull band across the face to the R pocket, rotating only your ribcage    10 x 2 reps    "Pulling seat belt"   Band over door knob with knot on the other side of the door Stand perpendicular to the door,  Exhale to pull band from R ear height across body to the L pocket without turning pelvis and knees  Follow hand with eyes/head  10 x 2 reps      minisquat with bicep curl blue band

## 2016-07-08 NOTE — Therapy (Signed)
[place] MAIN [facility] SERVICES [address] [place], [place], [address] Phone: [phone]   Fax:  [phone]  Physical Therapy Treatment  Patient Details  Name: Garrett Watkins MRN: [id number] Date of Birth: Jun 06, 1960 Referring Provider: Tresa Moore  Encounter Date: 07/08/2016      PT End of Session - 07/08/16 1501    Visit Number 7   Number of Visits 12   PT Start Time 1410   PT Stop Time 1505   PT Time Calculation (min) 55 min   Activity Tolerance Patient tolerated treatment well;No increased pain   Behavior During Therapy WFL for tasks assessed/performed      Past Medical History:  Diagnosis Date  . BPH (benign prostatic hyperplasia)   . Colon adenoma   . Dyslipidemia   . GERD (gastroesophageal reflux disease)    history of  . Hypertension   . OSA (obstructive sleep apnea)   . Prostate cancer [facility])     Past Surgical History:  Procedure Laterality Date  . CARDIAC CATHETERIZATION Right 01/24/2016   Procedure: Left Heart Cath and Coronary Angiography;  Surgeon: Corey Skains, MD;  Location: [place] CV LAB;  Service: Cardiovascular;  Laterality: Right;  . CYST EXCISION    . LYMPHADENECTOMY Bilateral 04/21/2016   Procedure: PELVIC LYMPHADENECTOMY;  Surgeon: Alexis Frock, MD;  Location: [facility] ORS;  Service: Urology;  Laterality: Bilateral;  . NO PAST SURGERIES    . ROBOT ASSISTED LAPAROSCOPIC RADICAL PROSTATECTOMY N/A 04/21/2016   Procedure: ROBOTIC ASSISTED LAPAROSCOPIC RADICAL PROSTATECTOMY;  Surgeon: Alexis Frock, MD;  Location: [facility] ORS;  Service: Urology;  Laterality: N/A;  . VASECTOMY      There were no vitals filed for this visit.      Subjective Assessment - 07/08/16 1418    Subjective Pt reported he was able to sleep for 4 hr increment for 4 days last week which is an improvement. Pt had deep sleep during 4 hr sleep. The past 3 days, pt relapsed back to 2.5 hrs intervals. Pt reported he performed lifting of boxes  when organize his garage and experienced more fatigue from the lifting on those nights. Pt has improved with no needing to go to the bathroom during a movies on two occasions. Pt's pain is subsiding and he only has a couple of places with soreness not pain.      Pertinent History Prostate CA, physical active, enjoys working out includes sit ups and crunches   Patient Stated Goals be normal again, go to the bathroom and be able to hold his urine before, sleep better with less interrupted trips to the urination                       Pelvic Floor Special Questions - 07/08/16 1500    External Palpation tensions. tenderness L suprapubic area, L transverse perineal superficial/ deep. R ischiocavernosus  post Tx, decreased tenderness            [facility] Adult PT Treatment/Exercise - 07/08/16 1500      Therapeutic Activites    Therapeutic Activities --     Neuro Re-ed    Neuro Re-ed Details  see pt instructions     Exercises   Exercises --  see pt instructions     Manual Therapy   Other Manual Therapy fascial release over L suprapubic area, L transverse perineal superficial/ deep. R ischiocavernosus  PT Education - 07/08/16 1501    Education provided Yes   Education Details HEP   Person(s) Educated Patient   Methods Explanation;Demonstration;Tactile cues;Verbal cues;Handout   Comprehension Returned demonstration;Verbalized understanding             PT Long Term Goals - 07/08/16 1504      PT LONG TERM GOAL #1   Title Pt will decrease getting up at night from 9-10 x/ night to < 4x/ night in order to improve sleep. Pt will also use his CPAP machine nighlt  (5/23: 6x/ night)    Time 12   Period Weeks   Status Achieved     PT LONG TERM GOAL #2   Title Pt will report decreased pain from 7/10 to < 2/10 at the end of uriation across 5 episodes    Time 8   Period Weeks   Status Achieved     PT LONG TERM GOAL #3   Title Pt will report being  able to sit for > 30 min  in order to sit through community events    Baseline 10 min before pain onset    Time 8   Period Weeks   Status Achieved     PT LONG TERM GOAL #4   Title Pt will report being able to sense the urge to urinate instead of the pain signal as the urge to urinate in order to sleep    Time 12   Period Weeks   Status Achieved     PT LONG TERM GOAL #5   Title Pt will decrease his IPPS score from 35 pts severe to < 19 pts ( moderate) and 6 pts in QOL score to < 3 pts in order to improve function and QOL   Time 12   Period Weeks   Status Partially Met     Additional Long Term Goals   Additional Long Term Goals Yes     PT LONG TERM GOAL #6   Title Pt will demo improved arthrokinematics mobility at SIJ B across 2 visits in order to progress to upright loaded deep core / thoracolumbar strengthening to not leak with pertubation movements in ADLs   Time 12   Period Weeks   Status Achieved     PT LONG TERM GOAL #7   Title Pt will demo proper alignemnt and coordination technique with weight lifting routine in order to minimzie relapse of Sx    Time 12   Period Weeks   Status New               Plan - 07/08/16 1501    Clinical Impression Statement Pt showed minor tensions over L suprapubic area, L transverse perineal superficial/ deep, R ischiocavernosus which decreased post Tx. Pt has had significantly less pelvic floor tensions and report of pain. Pt progressed to proper lifting mechanics and coordination along with PNF resistance training. Anticipate pt will continue to improve quality of sleep and progress towards fitness goals with skilled PT.     Rehab Potential Good   PT Frequency 2x / week   PT Duration 12 weeks   PT Treatment/Interventions Neuromuscular re-education;Therapeutic exercise;Therapeutic activities;Moist Heat;Energy conservation;Patient/family education;Scar mobilization;Gait training;Stair training;Functional mobility training   Consulted  and Agree with Plan of Care Patient      Patient will benefit from skilled therapeutic intervention in order to improve the following deficits and impairments:  Decreased coordination, Decreased safety awareness, Decreased strength, Decreased mobility, Decreased endurance, Hypomobility, Decreased range of motion,  Postural dysfunction, Pain  Visit Diagnosis: Other lack of coordination  Myalgia  Muscle weakness (generalized)     Problem List Patient Active Problem List   Diagnosis Date Noted  . Prostate cancer ([place]) 02/24/2016  . Urinary urgency 02/24/2016  . Chest pain 01/24/2016  . NSTEMI (non-ST elevated myocardial infarction) ([place]) 01/24/2016  . Elevated PSA 12/30/2015  . Urinary frequency 12/30/2015  . BPH (benign prostatic hyperplasia) 11/27/2015  . Dyslipidemia 11/27/2015  . Essential hypertension 11/27/2015  . Hyperlipidemia 11/27/2015  . Hypertension 11/27/2015  . Snoring 11/27/2015  . Colon adenoma 01/21/2012    Jerl Mina ,PT, DPT, E-RYT  07/08/2016, 4:03 PM  [place] MAIN [facility] SERVICES [address] [place], [place], [address] Phone: [phone]   Fax:  [phone]  Name: Garrett Watkins MRN: [id number] Date of Birth: 1960/10/12

## 2016-07-17 ENCOUNTER — Other Ambulatory Visit: Payer: BLUE CROSS/BLUE SHIELD

## 2016-07-21 ENCOUNTER — Ambulatory Visit: Payer: BLUE CROSS/BLUE SHIELD | Attending: Urology | Admitting: Physical Therapy

## 2016-07-21 ENCOUNTER — Ambulatory Visit: Payer: BLUE CROSS/BLUE SHIELD

## 2016-07-21 DIAGNOSIS — M6281 Muscle weakness (generalized): Secondary | ICD-10-CM | POA: Diagnosis not present

## 2016-07-21 DIAGNOSIS — M791 Myalgia, unspecified site: Secondary | ICD-10-CM

## 2016-07-21 DIAGNOSIS — R278 Other lack of coordination: Secondary | ICD-10-CM | POA: Insufficient documentation

## 2016-07-21 NOTE — Therapy (Signed)
[place] MAIN [facility] SERVICES [address] [place], [place], [address] Phone: [phone]   Fax:  [phone]  Physical Therapy Treatment  Patient Details  Name: Garrett Watkins MRN: [id number] Date of Birth: 10/17/1960 Referring Provider: Tresa Moore  Encounter Date: 07/21/2016      PT End of Session - 07/21/16 2333    Visit Number 8   Number of Visits 12   PT Start Time 1640   PT Stop Time 9753   PT Time Calculation (min) 65 min   Activity Tolerance Patient tolerated treatment well;No increased pain   Behavior During Therapy WFL for tasks assessed/performed      Past Medical History:  Diagnosis Date  . BPH (benign prostatic hyperplasia)   . Colon adenoma   . Dyslipidemia   . GERD (gastroesophageal reflux disease)    history of  . Hypertension   . OSA (obstructive sleep apnea)   . Prostate cancer [facility])     Past Surgical History:  Procedure Laterality Date  . CARDIAC CATHETERIZATION Right 01/24/2016   Procedure: Left Heart Cath and Coronary Angiography;  Surgeon: Corey Skains, MD;  Location: [place] CV LAB;  Service: Cardiovascular;  Laterality: Right;  . CYST EXCISION    . LYMPHADENECTOMY Bilateral 04/21/2016   Procedure: PELVIC LYMPHADENECTOMY;  Surgeon: Alexis Frock, MD;  Location: [facility] ORS;  Service: Urology;  Laterality: Bilateral;  . NO PAST SURGERIES    . ROBOT ASSISTED LAPAROSCOPIC RADICAL PROSTATECTOMY N/A 04/21/2016   Procedure: ROBOTIC ASSISTED LAPAROSCOPIC RADICAL PROSTATECTOMY;  Surgeon: Alexis Frock, MD;  Location: [facility] ORS;  Service: Urology;  Laterality: N/A;  . VASECTOMY      There were no vitals filed for this visit.      Subjective Assessment - 07/21/16 1641    Subjective (P)  Pt reported he was able to sleep for 4 hr increment for 4 days last week which is an improvement. Pt had deep sleep during 4 hr sleep. The past 3 days, pt relapsed back to 2.5 hrs intervals. Pt reported he performed lifting of  boxes when organize his garage and experienced more fatigue from the lifting on those nights. Pt has improved with no needing to go to the bathroom during a movies on two occasions. Pt's pain is subsiding and he only has a couple of places with soreness not pain.      Pertinent History (P)  Prostate CA, physical active, enjoys working out includes sit ups and crunches   Patient Stated Goals (P)  be normal again, go to the bathroom and be able to hold his urine before, sleep better with less interrupted trips to the urination             [facility] PT Assessment - 07/21/16 2331      Other:   Other/ Comments poor lumbopelvic dissassociation, postural stability in PNF exercises. required cues for proper coordination and alignment                      [facility] Adult PT Treatment/Exercise - 07/21/16 2332      Therapeutic Activites    Therapeutic Activities --  see pt instructions     Neuro Re-ed    Neuro Re-ed Details  see pt instructions , applied deep core in upright activities ( walking. jogging, squat to stand with quick pelvic floor contraction)                 PT Education - 07/21/16 2338  Education provided Yes   Education Details HEP   Person(s) Educated Patient   Methods Explanation;Demonstration;Tactile cues;Verbal cues;Handout   Comprehension Returned demonstration;Verbalized understanding;Verbal cues required;Tactile cues required             PT Long Term Goals - 07/21/16 2334      PT LONG TERM GOAL #1   Title Pt will decrease getting up at night from 9-10 x/ night to < 4x/ night in order to improve sleep. Pt will also use his CPAP machine nighlt  (5/23: 6x/ night)    Time 12   Period Weeks   Status Achieved     PT LONG TERM GOAL #2   Title Pt will report decreased pain from 7/10 to < 2/10 at the end of uriation across 5 episodes    Time 8   Period Weeks   Status Achieved     PT LONG TERM GOAL #3   Title Pt will report being able to sit for  > 30 min  in order to sit through community events    Baseline 10 min before pain onset    Time 8   Period Weeks   Status Achieved     PT LONG TERM GOAL #4   Title Pt will report being able to sense the urge to urinate instead of the pain signal as the urge to urinate in order to sleep    Time 12   Period Weeks   Status Achieved     PT LONG TERM GOAL #5   Title Pt will decrease his IPPS score from 35 pts severe to < 19 pts ( moderate) and 6 pts in QOL score to < 3 pts in order to improve function and QOL   Time 12   Period Weeks   Status Partially Met     PT LONG TERM GOAL #6   Title Pt will demo improved arthrokinematics mobility at SIJ B across 2 visits in order to progress to upright loaded deep core / thoracolumbar strengthening to not leak with pertubation movements in ADLs   Time 12   Period Weeks   Status Achieved     PT LONG TERM GOAL #7   Title Pt will demo proper alignemnt and coordination technique with weight lifting routine in order to minimzie relapse of Sx    Time 12   Period Weeks   Status Partially Met               Plan - 07/21/16 2334    Clinical Impression Statement With pt's report of no pain across the past 2 weeks, progressed pt to upright deep core co-activation in functional positions and functional strength training exercises  Pt demo'd improved lumbopelvic dissassociation and postural stability with resistance training following training today. Anticipate today's HEP will help improve his remaining issue with leakage that occurs with body turns/twists.  Plan to prepare pt for return to weight lifting at next session.  Educated pt about sexual function post-prostectatomy and to resume this sexual activity given the progress he has made with resolved pelvic pain and regain of pelvic floor ROM / no pelvic tensions.  Pt voiced understanding. Pt continues to advance towards his goals with skilled PT and his frequency has decreased to every other week due  to his progress.    Rehab Potential Good   PT Frequency 2x / month   PT Duration 12 weeks   PT Treatment/Interventions Neuromuscular re-education;Therapeutic exercise;Therapeutic activities;Moist Heat;Energy conservation;Patient/family education;Scar mobilization;Gait training;Stair training;Functional  mobility training   Consulted and Agree with Plan of Care Patient      Patient will benefit from skilled therapeutic intervention in order to improve the following deficits and impairments:  Decreased coordination, Decreased safety awareness, Decreased strength, Decreased mobility, Decreased endurance, Hypomobility, Decreased range of motion, Postural dysfunction, Pain  Visit Diagnosis: Other lack of coordination  Myalgia  Muscle weakness (generalized)     Problem List Patient Active Problem List   Diagnosis Date Noted  . Prostate cancer ([place]) 02/24/2016  . Urinary urgency 02/24/2016  . Chest pain 01/24/2016  . NSTEMI (non-ST elevated myocardial infarction) ([place]) 01/24/2016  . Elevated PSA 12/30/2015  . Urinary frequency 12/30/2015  . BPH (benign prostatic hyperplasia) 11/27/2015  . Dyslipidemia 11/27/2015  . Essential hypertension 11/27/2015  . Hyperlipidemia 11/27/2015  . Hypertension 11/27/2015  . Snoring 11/27/2015  . Colon adenoma 01/21/2012    Jerl Mina ,PT, DPT, E-RYT  07/21/2016, 11:38 PM  [place] MAIN [facility] SERVICES [address] [place], [place], [address] Phone: [phone]   Fax:  [phone]  Name: Garrett Watkins MRN: [id number] Date of Birth: 30-Jun-1960

## 2016-07-21 NOTE — Patient Instructions (Addendum)
Standing PNF band exercises   **FOCUS ON grounding your legs down to not let the hips turn    "Drawing a sword" " Pulling a lawnmover"  Band is over the edge of the door, knot is on the other side of the door,  R hand holds end of the band by L ear level   Press downward into the ballmounds and heels of the feet, thigh muscle active, Keep knees and hips squared the front and do not move them throughout the activity, Exhale to pull band across the face to the R pocket, rotating only your ribcage    10 x 3 reps    "Pulling seat belt"   Band over door knob with knot on the other side of the door Stand perpendicular to the door,  Exhale to pull band from R ear height across body to the L pocket without turning pelvis and knees  Follow hand with eyes/head  10 x 3 reps      Multifidis twist  Band is on doorknob: stand further away from door (facing perpendicular)   Twisting trunk without moving the hips and knees Hold band at between the ear and shoulder height   Exhale twist,.10-15 deg away from door without moving your hips/ knees. Continue to maintain equal weight through legs. Keep knee unlocked.  10 x 3   ______  Band with mini squat to rise with quick pelvic floor squeeze, then lengthening to lower into squat  10 x 3   _____ jogwalk 15-20 min, downgrade if need to speed walking if notice leakage

## 2016-08-04 ENCOUNTER — Other Ambulatory Visit: Payer: BLUE CROSS/BLUE SHIELD

## 2016-08-04 DIAGNOSIS — C61 Malignant neoplasm of prostate: Secondary | ICD-10-CM | POA: Diagnosis not present

## 2016-08-05 ENCOUNTER — Ambulatory Visit: Payer: BLUE CROSS/BLUE SHIELD | Admitting: Physical Therapy

## 2016-08-05 DIAGNOSIS — M6281 Muscle weakness (generalized): Secondary | ICD-10-CM | POA: Diagnosis not present

## 2016-08-05 DIAGNOSIS — R278 Other lack of coordination: Secondary | ICD-10-CM | POA: Diagnosis not present

## 2016-08-05 DIAGNOSIS — M791 Myalgia, unspecified site: Secondary | ICD-10-CM

## 2016-08-05 LAB — PSA

## 2016-08-05 NOTE — Patient Instructions (Signed)
Deep core level 2  Emphasize lengthening of pelvic floor with inhale 1-2- pause, exhale 2-1 pause    _____  Perineal msaage even if no longer having pain . To help with releasing pelvic floor tightness  _____  3 stretches on your back: with strap, relaxed head and shoulders   Happy baby pose  Cross over of thighs Figure -4    5 breaths

## 2016-08-06 ENCOUNTER — Ambulatory Visit: Payer: BLUE CROSS/BLUE SHIELD | Admitting: Physical Therapy

## 2016-08-06 NOTE — Therapy (Signed)
[place] MAIN [facility] SERVICES [address] [place], [place], [address] Phone: [phone]   Fax:  [phone]  Physical Therapy Treatment  Patient Details  Name: Garrett Watkins MRN: [id number] Date of Birth: 12/01/1960 Referring Provider: Tresa Moore  Encounter Date: 08/05/2016    Past Medical History:  Diagnosis Date  . BPH (benign prostatic hyperplasia)   . Colon adenoma   . Dyslipidemia   . GERD (gastroesophageal reflux disease)    history of  . Hypertension   . OSA (obstructive sleep apnea)   . Prostate cancer [facility])     Past Surgical History:  Procedure Laterality Date  . CARDIAC CATHETERIZATION Right 01/24/2016   Procedure: Left Heart Cath and Coronary Angiography;  Surgeon: Corey Skains, MD;  Location: [place] CV LAB;  Service: Cardiovascular;  Laterality: Right;  . CYST EXCISION    . LYMPHADENECTOMY Bilateral 04/21/2016   Procedure: PELVIC LYMPHADENECTOMY;  Surgeon: Alexis Frock, MD;  Location: [facility] ORS;  Service: Urology;  Laterality: Bilateral;  . NO PAST SURGERIES    . ROBOT ASSISTED LAPAROSCOPIC RADICAL PROSTATECTOMY N/A 04/21/2016   Procedure: ROBOTIC ASSISTED LAPAROSCOPIC RADICAL PROSTATECTOMY;  Surgeon: Alexis Frock, MD;  Location: [facility] ORS;  Service: Urology;  Laterality: N/A;  . VASECTOMY      There were no vitals filed for this visit.      Subjective Assessment - 08/06/16 2307    Subjective Pt has noticed more control with leakage with yard work and physical activity. Pt has had minor pain on L pubic area. Pt noticed he may have relapsed with difficulty emptying and having more urge at night.    Pertinent History Prostate CA, physical active, enjoys working out includes sit ups and crunches   Patient Stated Goals be normal again, go to the bathroom and be able to hold his urine before, sleep better with less interrupted trips to the urination                       Pelvic Floor Special Questions  - 08/06/16 2249    External Perineal Exam performed with clothing doffed with verbal consent   External Palpation tensions/ tenderness noted L suprapubic area, ischicavernosus,  L transverse perineal superficial/ deep.            [place] Adult PT Treatment/Exercise - 08/06/16 2244      Neuro Re-ed    Neuro Re-ed Details  see pt instructions , applied deep core in upright activities ( walking. jogging, squat to stand with quick pelvic floor contraction)      Exercises   Exercises --  see pt instructions     Manual Therapy   Other Manual Therapy fascial release over L suprapubic area, ischicavernosus,  L transverse perineal superficial/ deep.                  PT Education - 08/06/16 2251    Education provided Yes   Education Details HEP   Person(s) Educated Patient   Methods Explanation;Demonstration;Tactile cues;Verbal cues;Handout   Comprehension Returned demonstration;Verbalized understanding             PT Long Term Goals - 07/21/16 2334      PT LONG TERM GOAL #1   Title Pt will decrease getting up at night from 9-10 x/ night to < 4x/ night in order to improve sleep. Pt will also use his CPAP machine nighlt  (5/23: 6x/ night)    Time 12   Period Weeks  Status Achieved     PT LONG TERM GOAL #2   Title Pt will report decreased pain from 7/10 to < 2/10 at the end of uriation across 5 episodes    Time 8   Period Weeks   Status Achieved     PT LONG TERM GOAL #3   Title Pt will report being able to sit for > 30 min  in order to sit through community events    Baseline 10 min before pain onset    Time 8   Period Weeks   Status Achieved     PT LONG TERM GOAL #4   Title Pt will report being able to sense the urge to urinate instead of the pain signal as the urge to urinate in order to sleep    Time 12   Period Weeks   Status Achieved     PT LONG TERM GOAL #5   Title Pt will decrease his IPPS score from 35 pts severe to < 19 pts ( moderate) and 6 pts in  QOL score to < 3 pts in order to improve function and QOL   Time 12   Period Weeks   Status Partially Met     PT LONG TERM GOAL #6   Title Pt will demo improved arthrokinematics mobility at SIJ B across 2 visits in order to progress to upright loaded deep core / thoracolumbar strengthening to not leak with pertubation movements in ADLs   Time 12   Period Weeks   Status Achieved     PT LONG TERM GOAL #7   Title Pt will demo proper alignemnt and coordination technique with weight lifting routine in order to minimzie relapse of Sx    Time 12   Period Weeks   Status Partially Met               Plan - 08/06/16 2251    Clinical Impression Statement Pt reported less leakage with yard work and quick movements of body in daily activities and his suprapelvic pain is very minimal.  However, pt reports increased urge and difficulty with completely emptying. Assessment showed increased fascial and muscular tensions/ tenderness L > R.  Emphasized more lengthening of pelvic floor and to resume self -perineal massage/new perineal stretches. Explained this HEP will be beneficial even if pt is feeling less pain in order to maintain soft tissue mobility while pt increases his level of functional activities.  Withholding return to fitness/weight lifting until pt demonstrates a balance of pelvic floor strengthening/ lengthening and less overactivity of pelvic floor mm.  Pt is progressing well with skilled PT.   Rehab Potential Good   PT Frequency Biweekly   PT Duration 12 weeks   PT Treatment/Interventions Neuromuscular re-education;Therapeutic exercise;Therapeutic activities;Moist Heat;Energy conservation;Patient/family education;Scar mobilization;Gait training;Stair training;Functional mobility training   Consulted and Agree with Plan of Care Patient      Patient will benefit from skilled therapeutic intervention in order to improve the following deficits and impairments:  Decreased coordination,  Decreased safety awareness, Decreased strength, Decreased mobility, Decreased endurance, Hypomobility, Decreased range of motion, Postural dysfunction, Pain  Visit Diagnosis: Other lack of coordination  Myalgia  Muscle weakness (generalized)     Problem List Patient Active Problem List   Diagnosis Date Noted  . Prostate cancer ([place]) 02/24/2016  . Urinary urgency 02/24/2016  . Chest pain 01/24/2016  . NSTEMI (non-ST elevated myocardial infarction) ([place]) 01/24/2016  . Elevated PSA 12/30/2015  . Urinary frequency 12/30/2015  . BPH (  benign prostatic hyperplasia) 11/27/2015  . Dyslipidemia 11/27/2015  . Essential hypertension 11/27/2015  . Hyperlipidemia 11/27/2015  . Hypertension 11/27/2015  . Snoring 11/27/2015  . Colon adenoma 01/21/2012    Jerl Mina ,PT, DPT, E-RYT  08/06/2016, 11:07 PM  [place] MAIN [facility] SERVICES [address] [place], [place], [address] Phone: [phone]   Fax:  [phone]  Name: Garrett Watkins MRN: [id number] Date of Birth: [birth year]

## 2016-08-07 ENCOUNTER — Telehealth: Payer: Self-pay | Admitting: Family Medicine

## 2016-08-07 NOTE — Telephone Encounter (Signed)
Left message on machine for patient to call back.

## 2016-08-07 NOTE — Telephone Encounter (Signed)
-----   Message from Festus Aloe, MD sent at 08/07/2016 11:26 AM EDT ----- Good news - PSA is undetectable, prostate cancer is gone -- keep f/u as planned or see [place] in at least 3 months   ----- Message ----- From: Leia Alf, CMA Sent: 08/05/2016   8:21 AM To: Festus Aloe, MD    ----- Message ----- From: Interface, [facility] Lab Results In Sent: 08/05/2016   5:42 AM To: Rowe Robert Clinical

## 2016-08-10 NOTE — Telephone Encounter (Signed)
Spoke with pt in reference to PSA results. Pt stated that he has an appt tomorrow. Reinforced with pt to keep that appt.

## 2016-08-11 ENCOUNTER — Ambulatory Visit (INDEPENDENT_AMBULATORY_CARE_PROVIDER_SITE_OTHER): Payer: BLUE CROSS/BLUE SHIELD | Admitting: Urology

## 2016-08-11 ENCOUNTER — Encounter: Payer: Self-pay | Admitting: Urology

## 2016-08-11 VITALS — BP 188/82 | HR 65 | Ht 73.0 in | Wt 220.0 lb

## 2016-08-11 DIAGNOSIS — C61 Malignant neoplasm of prostate: Secondary | ICD-10-CM | POA: Diagnosis not present

## 2016-08-11 DIAGNOSIS — N5231 Erectile dysfunction following radical prostatectomy: Secondary | ICD-10-CM | POA: Insufficient documentation

## 2016-08-11 DIAGNOSIS — N393 Stress incontinence (female) (male): Secondary | ICD-10-CM | POA: Diagnosis not present

## 2016-08-11 NOTE — Progress Notes (Signed)
08/11/2016 6:56 AM   Garrett Watkins [birth year] [id number]  Referring provider: Kirk Ruths, MD [facility] [place], [place] [address]  CC: 3 month FU prostate cancer after prostatectomy  HPI:  1. Moderate Risk Prostate cancer  - s/p robotic prostatectomy 04/2016 for pT2N0Mx Gleason 4+3=7 adenocarcinoma with NEGATIVE margins. Pre-op PSA 4.1. Post-op Course: 07/2016 - PSA <0.1   2. SUI (stress urinary incontinence), male - s/p prostatectomy 04/2016. At 3 mos down to 0-1 pads and negative office CST.   3. Erectile dysfunction after radical prostatectomy  - s/p prostatectomy 04/2016. At 3 mos getting some fullness with stimulation but not adequate to penerate, declines meds.   Today "Garrett Watkins" is seen in f/u above. He was treated for enterococcus cystitis few most ago. Most recent UCX negative x2. PSA UNdetectable.    PMH: Past Medical History:  Diagnosis Date  . BPH (benign prostatic hyperplasia)   . Colon adenoma   . Dyslipidemia   . GERD (gastroesophageal reflux disease)    history of  . Hypertension   . OSA (obstructive sleep apnea)   . Prostate cancer [facility])     Surgical History: Past Surgical History:  Procedure Laterality Date  . CARDIAC CATHETERIZATION Right 01/24/2016   Procedure: Left Heart Cath and Coronary Angiography;  Surgeon: Corey Skains, MD;  Location: [place] CV LAB;  Service: Cardiovascular;  Laterality: Right;  . CYST EXCISION    . LYMPHADENECTOMY Bilateral 04/21/2016   Procedure: PELVIC LYMPHADENECTOMY;  Surgeon: Alexis Frock, MD;  Location: [facility] ORS;  Service: Urology;  Laterality: Bilateral;  . NO PAST SURGERIES    . ROBOT ASSISTED LAPAROSCOPIC RADICAL PROSTATECTOMY N/A 04/21/2016   Procedure: ROBOTIC ASSISTED LAPAROSCOPIC RADICAL PROSTATECTOMY;  Surgeon: Alexis Frock, MD;  Location: [facility] ORS;  Service: Urology;  Laterality: N/A;  . VASECTOMY      Home Medications:  Allergies as of 08/11/2016    Reactions   Amlodipine Other (See Comments)   Swelling in ankles (edema)---with higher doses per patient.      Medication List       Accurate as of 08/11/16  6:56 AM. Always use your most recent med list.          carvedilol 12.5 MG tablet Commonly known as:  COREG Take 12.5 mg by mouth 2 (two) times daily.   hydrochlorothiazide 25 MG tablet Commonly known as:  HYDRODIURIL Take 25 mg by mouth daily.   ibuprofen 200 MG tablet Commonly known as:  ADVIL,MOTRIN Take 200 mg by mouth every 6 (six) hours as needed.   multivitamin with minerals Tabs tablet Take 1 tablet by mouth daily.   senna-docusate 8.6-50 MG tablet Commonly known as:  Senokot-S Take 1 tablet by mouth 2 (two) times daily. While taking strong pain meds to prevent constipation.   sulfamethoxazole-trimethoprim 800-160 MG tablet Commonly known as:  BACTRIM DS,SEPTRA DS Take 1 tablet by mouth every 12 (twelve) hours.   telmisartan 80 MG tablet Commonly known as:  MICARDIS Take 80 mg by mouth daily.   TONALIN SAFFLOWER OIL CLA PO Take 800 mg by mouth 2 (two) times daily.   TURMERIC PO Take 1,200 mg by mouth daily. TURMERIC ROOT EXTRACT       Allergies:  Allergies  Allergen Reactions  . Amlodipine Other (See Comments)    Swelling in ankles (edema)---with higher doses per patient.    Family History: Family History  Problem Relation Age of Onset  . CAD Mother   .  Prostate cancer Neg Hx     Social History:  reports that he has never smoked. He has never used smokeless tobacco. He reports that he does not drink alcohol or use drugs.     Review of Systems  Gastrointestinal (upper)  : Negative for upper GI symptoms  Gastrointestinal (lower) : Negative for lower GI symptoms  Constitutional : Negative for symptoms  Skin: Negative for skin symptoms  Eyes: Negative for eye symptoms  Ear/Nose/Throat : Negative for Ear/Nose/Throat symptoms  Hematologic/Lymphatic: Negative for  Hematologic/Lymphatic symptoms  Cardiovascular : Negative for cardiovascular symptoms  Respiratory : Negative for respiratory symptoms  Endocrine: Negative for endocrine symptoms  Musculoskeletal: Negative for musculoskeletal symptoms  Neurological: Negative for neurological symptoms  Psychologic: Negative for psychiatric symptoms   Physical Exam: There were no vitals taken for this visit.  Constitutional:  Alert and oriented, No acute distress. HEENT: [place] AT, moist mucus membranes.  Trachea midline, no masses. Cardiovascular: No clubbing, cyanosis, or edema. Respiratory: Normal respiratory effort, no increased work of breathing. GI: Abdomen is soft, nontender, nondistended, no abdominal masses GU: No CVA tenderness. Prior abd scars w/o hernias.  Skin: No rashes, bruises or suspicious lesions. Lymph: No cervical or inguinal adenopathy. Neurologic: Grossly intact, no focal deficits, moving all 4 extremities. Psychiatric: Normal mood and affect.  Laboratory Data: Lab Results  Component Value Date   WBC 4.1 04/10/2016   HGB 12.5 (L) 04/22/2016   HCT 37.5 (L) 04/22/2016   MCV 89.4 04/10/2016   PLT 179 04/10/2016    Lab Results  Component Value Date   CREATININE 0.98 04/22/2016    No results found for: PSA  No results found for: TESTOSTERONE  No results found for: HGBA1C  Urinalysis    Component Value Date/Time   COLORURINE YELLOW (A) 04/10/2016 0848   APPEARANCEUR Clear 06/02/2016 1355   LABSPEC 1.029 04/10/2016 0848   PHURINE 6.0 04/10/2016 0848   GLUCOSEU Negative 06/02/2016 1355   HGBUR NEGATIVE 04/10/2016 0848   BILIRUBINUR Negative 06/02/2016 1355   KETONESUR NEGATIVE 04/10/2016 0848   PROTEINUR Negative 06/02/2016 1355   PROTEINUR NEGATIVE 04/10/2016 0848   NITRITE Negative 06/02/2016 1355   NITRITE NEGATIVE 04/10/2016 0848   LEUKOCYTESUR Negative 06/02/2016 1355     Assessment & Plan:    1. Prostate cancer ([place]) - excellent prognosis with  node negative, margin negative disease and PSA now undetecbatle. Rec Q65mo surveillance until 2020, then yearly until 2028.   2. SUI (stress urinary incontinence), male - doing well, reinforced importance of Kegel's and time course for improvement.   3. Erectile dysfunction after radical prostatectomy - discussed management options of observtion, oral meds, injection meds and he opts for observatoin at this point, may want to try PDE5i in future.  RTC 69mos with PSA prior.    Alexis Frock, [facility] [address], [place], [place] [address] [phone]

## 2016-08-17 ENCOUNTER — Ambulatory Visit: Payer: BLUE CROSS/BLUE SHIELD | Admitting: Physical Therapy

## 2016-08-19 DIAGNOSIS — I10 Essential (primary) hypertension: Secondary | ICD-10-CM | POA: Diagnosis not present

## 2016-08-25 ENCOUNTER — Ambulatory Visit: Payer: BLUE CROSS/BLUE SHIELD | Attending: Urology | Admitting: Physical Therapy

## 2016-08-25 ENCOUNTER — Encounter: Payer: Self-pay | Admitting: Physical Therapy

## 2016-08-25 VITALS — BP 170/100

## 2016-08-25 DIAGNOSIS — M791 Myalgia, unspecified site: Secondary | ICD-10-CM

## 2016-08-25 DIAGNOSIS — R278 Other lack of coordination: Secondary | ICD-10-CM | POA: Insufficient documentation

## 2016-08-25 DIAGNOSIS — M6281 Muscle weakness (generalized): Secondary | ICD-10-CM | POA: Diagnosis not present

## 2016-08-26 NOTE — Therapy (Addendum)
[place] MAIN [facility] SERVICES [address] [place], [place], [address] Phone: [phone]   Fax:  [phone]  Physical Therapy Treatment  Patient Details  Name: Garrett Watkins MRN: [id number] Date of Birth: [birth year] Referring Provider: Tresa Moore  Encounter Date: 08/25/2016      PT End of Session - 08/26/16 1528    Visit Number 10   Number of Visits 12   PT Start Time 0817   PT Stop Time 0908   PT Time Calculation (min) 51 min   Activity Tolerance Patient tolerated treatment well;No increased pain   Behavior During Therapy WFL for tasks assessed/performed      Past Medical History:  Diagnosis Date  . BPH (benign prostatic hyperplasia)   . Colon adenoma   . Dyslipidemia   . GERD (gastroesophageal reflux disease)    history of  . Hypertension   . OSA (obstructive sleep apnea)   . Prostate cancer [facility])     Past Surgical History:  Procedure Laterality Date  . CARDIAC CATHETERIZATION Right 01/24/2016   Procedure: Left Heart Cath and Coronary Angiography;  Surgeon: Corey Skains, MD;  Location: [place] CV LAB;  Service: Cardiovascular;  Laterality: Right;  . CYST EXCISION    . LYMPHADENECTOMY Bilateral 04/21/2016   Procedure: PELVIC LYMPHADENECTOMY;  Surgeon: Alexis Frock, MD;  Location: [facility] ORS;  Service: Urology;  Laterality: Bilateral;  . NO PAST SURGERIES    . ROBOT ASSISTED LAPAROSCOPIC RADICAL PROSTATECTOMY N/A 04/21/2016   Procedure: ROBOTIC ASSISTED LAPAROSCOPIC RADICAL PROSTATECTOMY;  Surgeon: Alexis Frock, MD;  Location: [facility] ORS;  Service: Urology;  Laterality: N/A;  . VASECTOMY      Vitals:   08/25/16 0846  BP: (!) 170/100        Subjective Assessment - 08/25/16 0820    Subjective Pt reports he is doing okay. Pt reported he is getting up in the night 3x/ night and he is not sure whether he is doing that out of fear or not emptying all the way. During the day, pt feels like he is emptying 3x/ hr after  drinking alot of water. When working in the yard, pt will not need to go until 3-4 hrs.  Pt IS ABLE TO RUN A MILE AND NOT NOTICING ANY LEAKAGE! Pt reported his BP has been high for past 2 years prior to prostate surgery and within the last year, his BP has increased and remained around 170-180 mm Hg/ 90-110 mm Hg. Pt has seen his PCP last week for check up and it was high then. Pt has requested his PCP to refer him to a nephrologist to find underlying reason for high BP. Upon further questioning, pt reported he has been wearing a CPAP for OSA for the 8 years but has not had an updated study. Pt's wife reports to him that she has heard him breathing erratically and strange noises even when he is wearing the CPAP.     Pertinent History Prostate CA, physical active, enjoys working out includes sit ups and crunches   Patient Stated Goals be normal again, go to the bathroom and be able to hold his urine before, sleep better with less interrupted trips to the urination                  O: improved trunk control and able to dissociate without cues        [facility] Adult PT Treatment/Exercise - 08/26/16 1528      Therapeutic Activites  Therapeutic Activities --  see pt instructions     Neuro Re-ed    Neuro Re-ed Details  see pt instructions                      PT Long Term Goals - 08/25/16 0845      PT LONG TERM GOAL #1   Title Pt will decrease getting up at night from 9-10 x/ night to < 4x/ night in order to improve sleep. Pt will also use his CPAP machine nighlt  (5/23: 6x/ night)    Time 12   Period Weeks   Status Achieved     PT LONG TERM GOAL #2   Title Pt will report decreased pain from 7/10 to < 2/10 at the end of uriation across 5 episodes    Time 8   Period Weeks   Status Achieved     PT LONG TERM GOAL #3   Title Pt will report being able to sit for > 30 min  in order to sit through community events    Baseline 10 min before pain onset    Time 8    Period Weeks   Status Achieved     PT LONG TERM GOAL #4   Title Pt will report being able to sense the urge to urinate instead of the pain signal as the urge to urinate in order to sleep    Time 12   Period Weeks   Status Achieved     PT LONG TERM GOAL #5   Title Pt will decrease his IPPS score from 35 pts severe to < 19 pts ( moderate) and 6 pts in QOL score to < 3 pts in order to improve function and QOL  ( 8/14: 20 lower range of severe, 6 pts )     Time 12   Period Weeks   Status Partially Met     PT LONG TERM GOAL #6   Title Pt will demo improved arthrokinematics mobility at SIJ B across 2 visits in order to progress to upright loaded deep core / thoracolumbar strengthening to not leak with pertubation movements in ADLs   Time 12   Period Weeks   Status Achieved     PT LONG TERM GOAL #7   Title Pt will demo proper alignemnt and coordination technique with weight lifting routine in order to minimzie relapse of Sx    Time 12   Period Weeks   Status Partially Met               Plan - 08/26/16 1529    Clinical Impression Statement Pt is been able to return to running and doing daytime activities with minimal leakage and resolved pain. However, pt reports continued night time voiding episodes. Pt's BP has been elevated for the past 3 years and he has been working with his MDs to understand the reason. PCP has also referred him to a nephrologist per his request.  With further inquiry re: her CPAP use, pt reported he has not had an updated sleep study for 8 years.  PT advised pt to contact his PCP to request an updated sleep study in case the settings to his CPAP needs to be changed. Pt also educated with research articles about the impact of unmanaged OSA on cardiovascular health and  nocturia. Pt voiced understanding. PT to f/u with PCP.  Advised pt to withhold running until his BP is under control. Today's session, pt 's BP  170/100 mm Hg upon arrival. Lower trap activation in  resistance strength training. Plan to address rotator cuff and scapular stabilizing mm strengthening into future sessions to help pt return back to weight lifting/ strength training with minimize risk for relapsed Sx and / or injuries.  Pt continues to benefit from skilled PT.    Rehab Potential Good   PT Frequency Biweekly   PT Duration 12 weeks   PT Treatment/Interventions Neuromuscular re-education;Therapeutic exercise;Therapeutic activities;Moist Heat;Energy conservation;Patient/family education;Scar mobilization;Gait training;Stair training;Functional mobility training   Consulted and Agree with Plan of Care Patient      Patient will benefit from skilled therapeutic intervention in order to improve the following deficits and impairments:  Decreased coordination, Decreased safety awareness, Decreased strength, Decreased mobility, Decreased endurance, Hypomobility, Decreased range of motion, Postural dysfunction, Pain  Visit Diagnosis: Other lack of coordination  Myalgia  Muscle weakness (generalized)     Problem List Patient Active Problem List   Diagnosis Date Noted  . SUI (stress urinary incontinence), male 08/11/2016  . Erectile dysfunction after radical prostatectomy 08/11/2016  . Prostate cancer ([place]) 02/24/2016  . Urinary urgency 02/24/2016  . Chest pain 01/24/2016  . NSTEMI (non-ST elevated myocardial infarction) ([place]) 01/24/2016  . Urinary frequency 12/30/2015  . Dyslipidemia 11/27/2015  . Essential hypertension 11/27/2015  . Hyperlipidemia 11/27/2015  . Hypertension 11/27/2015  . Snoring 11/27/2015  . Colon adenoma 01/21/2012    Jerl Mina ,PT, DPT, E-RYT  08/26/2016, 3:31 PM  [place] MAIN [facility] SERVICES [address] [place], [place], [address] Phone: [phone]   Fax:  [phone]  Name: Garrett Watkins MRN: [id number] Date of Birth: May 25, 1960

## 2016-08-27 NOTE — Patient Instructions (Signed)
Discussed getting an updated sleep study due to the research that shows correlation between sleep apnea and nocturia  [employer]pdf   Use of cable on total gym: Low weights  10 reps  Lat pull behind , forward lean Cross diagonals from top , and from bottom, low weights 10 reps  , exhale with pull

## 2016-08-31 ENCOUNTER — Ambulatory Visit: Payer: BLUE CROSS/BLUE SHIELD | Admitting: Physical Therapy

## 2016-08-31 VITALS — BP 140/90

## 2016-08-31 DIAGNOSIS — M6281 Muscle weakness (generalized): Secondary | ICD-10-CM

## 2016-08-31 DIAGNOSIS — M791 Myalgia, unspecified site: Secondary | ICD-10-CM

## 2016-08-31 DIAGNOSIS — R278 Other lack of coordination: Secondary | ICD-10-CM | POA: Diagnosis not present

## 2016-08-31 NOTE — Patient Instructions (Addendum)
DAILY:   1) deep core level 2  ( knee out ) 6 min in morning and evening  2) Seated  Short quick pelvic floor  contractions   5 quicks, 1 sec x  5-7 x / day   ________  Alternate every other day:  1) A. Cross diagonals with band  - drawing the sword      B. Cross diagonals with band - Pulling lawnmover      C. multifidis twist   2) A. Rotator cuff series:  10 reps each          Internal rotation with towel under elbow, start at 90 deg and puill band towards belly button ( double blue band)      B. Rotator cuff series:             External rotation with towel under elbow, start at 90 deg and puill band away belly button  ( single blue band)       C. Dumbbells moving them away from hips ~30 deg           Dumbbells held with thumbs up, raising 30 deg from hips   3) A. Blue band at thighs          Mini squat and side step 4 laps down hallway  L and R      B.  Complimentary stretch:  5 breaths each side           Figure 4 stretch with anterior tilt of pelvis           Cross L thigh over,R hand on L thigh, inhale ti lengthen spine, exhale to twist to L

## 2016-08-31 NOTE — Therapy (Addendum)
[place] MAIN [facility] SERVICES [address] [place], [place], [address] Phone: [phone]   Fax:  [phone]  Physical Therapy Treatment  Patient Details  Name: Garrett Watkins MRN: [id number] Date of Birth: [birth year] Referring Provider: Tresa Moore  Encounter Date: 08/31/2016      PT End of Session - 08/31/16 1102    Visit Number 11   Number of Visits 12   PT Start Time 7622   PT Stop Time 6333   PT Time Calculation (min) 44 min   Activity Tolerance Patient tolerated treatment well;No increased pain   Behavior During Therapy WFL for tasks assessed/performed      Past Medical History:  Diagnosis Date  . BPH (benign prostatic hyperplasia)   . Colon adenoma   . Dyslipidemia   . GERD (gastroesophageal reflux disease)    history of  . Hypertension   . OSA (obstructive sleep apnea)   . Prostate cancer [facility])     Past Surgical History:  Procedure Laterality Date  . CARDIAC CATHETERIZATION Right 01/24/2016   Procedure: Left Heart Cath and Coronary Angiography;  Surgeon: Corey Skains, MD;  Location: [place] CV LAB;  Service: Cardiovascular;  Laterality: Right;  . CYST EXCISION    . LYMPHADENECTOMY Bilateral 04/21/2016   Procedure: PELVIC LYMPHADENECTOMY;  Surgeon: Alexis Frock, MD;  Location: [facility] ORS;  Service: Urology;  Laterality: Bilateral;  . NO PAST SURGERIES    . ROBOT ASSISTED LAPAROSCOPIC RADICAL PROSTATECTOMY N/A 04/21/2016   Procedure: ROBOTIC ASSISTED LAPAROSCOPIC RADICAL PROSTATECTOMY;  Surgeon: Alexis Frock, MD;  Location: [facility] ORS;  Service: Urology;  Laterality: N/A;  . VASECTOMY      Vitals:   08/31/16 1018 08/31/16 1050  BP: (!) 160/100 140/90        Subjective Assessment - 08/31/16 1106    Subjective Pt has been sleeping better after changing his CPAP mask. Pt notices he is having more control with urination at night and also no longer n eeds to take the Advil for sleep.  Pt still no longer has pain in the  pelvic area. Pt is trying not to strain with urination.      Pertinent History Prostate CA, physical active, enjoys working out includes sit ups and crunches   Patient Stated Goals be normal again, go to the bathroom and be able to hold his urine before, sleep better with less interrupted trips to the urination             [facility] PT Assessment - 08/31/16 1218      Observation/Other Assessments   Observations less cuing required with disassociation of trunk and pelvis      Squat   Comments cued for knee/ ankle alignment                   Pelvic Floor Special Questions - 08/31/16 1024    External Palpation 5 reps, 1 sec         Treatment:  See pt instructions            PT Education - 08/31/16 1218    Education provided Yes   Education Details HEP   Person(s) Educated Patient   Methods Explanation;Demonstration;Tactile cues;Verbal cues;Handout   Comprehension Verbalized understanding;Returned demonstration             PT Long Term Goals - 08/31/16 1019      PT LONG TERM GOAL #1   Title Pt will decrease getting up at night from 9-10 x/ night  to < 4x/ night in order to improve sleep. Pt will also use his CPAP machine nighlt  (5/23: 6x/ night)    Time 12   Period Weeks   Status Achieved     PT LONG TERM GOAL #2   Title Pt will report decreased pain from 7/10 to < 2/10 at the end of uriation across 5 episodes    Time 8   Period Weeks   Status Achieved     PT LONG TERM GOAL #3   Title Pt will report being able to sit for > 30 min  in order to sit through community events    Baseline 10 min before pain onset    Time 8   Period Weeks   Status Achieved     PT LONG TERM GOAL #4   Title Pt will report being able to sense the urge to urinate instead of the pain signal as the urge to urinate in order to sleep    Time 12   Period Weeks   Status Achieved     PT LONG TERM GOAL #5   Title Pt will decrease his IPPS score from 35 pts severe to < 19  pts ( moderate) and 6 pts in QOL score to < 3 pts in order to improve function and QOL  ( 8/14: 20 lower range of severe, 6 pts )     Time 12   Period Weeks   Status Partially Met     PT LONG TERM GOAL #6   Title Pt will demo improved arthrokinematics mobility at SIJ B across 2 visits in order to progress to upright loaded deep core / thoracolumbar strengthening to not leak with pertubation movements in ADLs   Time 12   Period Weeks   Status Achieved     PT LONG TERM GOAL #7   Title Pt will demo proper alignemnt and coordination technique with weight lifting routine in order to minimzie relapse of Sx    Time 12   Period Weeks   Status Partially Met               Plan - 08/31/16 1103    Clinical Impression Statement Pt reported he is sleeping better as he has changed his CPAP mask. Pt is getting up 4x/ night and will be undergoing an updated sleep study next week. Pt is following advise from PT to not run until his high BP issue has been addressed. Pt will be seeing a nephrologist in Sept.  Today, pt progressed to hip abductor/rotator cuff strengthening today.  Pt required cues for proper technique and demo'd correctly post training. Pt continues to benefit from skilled PT.    Rehab Potential Good   PT Frequency Biweekly   PT Duration 12 weeks   PT Treatment/Interventions Neuromuscular re-education;Therapeutic exercise;Therapeutic activities;Moist Heat;Energy conservation;Patient/family education;Scar mobilization;Gait training;Stair training;Functional mobility training   Consulted and Agree with Plan of Care Patient      Patient will benefit from skilled therapeutic intervention in order to improve the following deficits and impairments:  Decreased coordination, Decreased safety awareness, Decreased strength, Decreased mobility, Decreased endurance, Hypomobility, Decreased range of motion, Postural dysfunction, Pain  Visit Diagnosis: Other lack of  coordination  Myalgia  Muscle weakness (generalized)     Problem List Patient Active Problem List   Diagnosis Date Noted  . SUI (stress urinary incontinence), male 08/11/2016  . Erectile dysfunction after radical prostatectomy 08/11/2016  . Prostate cancer ([place]) 02/24/2016  . Urinary urgency 02/24/2016  .  Chest pain 01/24/2016  . NSTEMI (non-ST elevated myocardial infarction) ([place]) 01/24/2016  . Urinary frequency 12/30/2015  . Dyslipidemia 11/27/2015  . Essential hypertension 11/27/2015  . Hyperlipidemia 11/27/2015  . Hypertension 11/27/2015  . Snoring 11/27/2015  . Colon adenoma 01/21/2012    Jerl Mina ,PT, DPT, E-RYT  08/31/2016, 12:19 PM  [place] MAIN [facility] SERVICES [address] [place], [place], [address] Phone: [phone]   Fax:  [phone]  Name: Garrett Watkins MRN: [id number] Date of Birth: Jul 24, 1960

## 2016-09-10 DIAGNOSIS — G4733 Obstructive sleep apnea (adult) (pediatric): Secondary | ICD-10-CM | POA: Diagnosis not present

## 2016-09-12 DIAGNOSIS — G4733 Obstructive sleep apnea (adult) (pediatric): Secondary | ICD-10-CM | POA: Diagnosis not present

## 2016-09-16 ENCOUNTER — Ambulatory Visit: Payer: BLUE CROSS/BLUE SHIELD | Attending: Urology | Admitting: Physical Therapy

## 2016-09-16 VITALS — BP 180/100

## 2016-09-16 DIAGNOSIS — M791 Myalgia, unspecified site: Secondary | ICD-10-CM

## 2016-09-16 DIAGNOSIS — M6281 Muscle weakness (generalized): Secondary | ICD-10-CM | POA: Insufficient documentation

## 2016-09-16 DIAGNOSIS — R278 Other lack of coordination: Secondary | ICD-10-CM | POA: Diagnosis not present

## 2016-09-16 NOTE — Addendum Note (Signed)
Addended by: Jerl Mina on: 09/16/2016 10:19 AM   Modules accepted: Orders

## 2016-09-17 NOTE — Therapy (Signed)
[place] MAIN [facility] SERVICES [address] [place], [place], [address] Phone: [phone]   Fax:  [phone]  Physical Therapy Treatment  Patient Details  Name: Garrett Watkins MRN: [id number] Date of Birth: [birth year] Referring Provider: Tresa Moore  Encounter Date: 09/16/2016      PT End of Session - 09/17/16 1042    Visit Number 12   Date for PT Re-Evaluation 11/18/16   PT Start Time 1007   PT Stop Time 1110   PT Time Calculation (min) 63 min   Activity Tolerance Patient tolerated treatment well;No increased pain   Behavior During Therapy WFL for tasks assessed/performed      Past Medical History:  Diagnosis Date  . BPH (benign prostatic hyperplasia)   . Colon adenoma   . Dyslipidemia   . GERD (gastroesophageal reflux disease)    history of  . Hypertension   . OSA (obstructive sleep apnea)   . Prostate cancer [facility])     Past Surgical History:  Procedure Laterality Date  . CARDIAC CATHETERIZATION Right 01/24/2016   Procedure: Left Heart Cath and Coronary Angiography;  Surgeon: Corey Skains, MD;  Location: [place] [facility];  Service: Cardiovascular;  Laterality: Right;  . CYST EXCISION    . LYMPHADENECTOMY Bilateral 04/21/2016   Procedure: PELVIC LYMPHADENECTOMY;  Surgeon: Alexis Frock, MD;  Location: [facility] ORS;  Service: Urology;  Laterality: Bilateral;  . NO PAST SURGERIES    . ROBOT ASSISTED LAPAROSCOPIC RADICAL PROSTATECTOMY N/A 04/21/2016   Procedure: ROBOTIC ASSISTED LAPAROSCOPIC RADICAL PROSTATECTOMY;  Surgeon: Alexis Frock, MD;  Location: [facility] ORS;  Service: Urology;  Laterality: N/A;  . VASECTOMY      Vitals:   09/16/16 1030 09/16/16 1109  BP: (!) 155/98 (!) 180/100        Subjective Assessment - 09/16/16 1010    Subjective pt reports he underwent a sleep study and will be getting results at the end of this week. Pt states he has been sleeping ok and waking more than he used to. Now pt is getting up to use the  bathroom 2x per night consistently which is an improvement. Pt reports decreased soreness in bulbospongiosus with continued self-massage.      Pertinent History Prostate CA, physical active, enjoys working out includes sit ups and crunches   Patient Stated Goals be normal again, go to the bathroom and be able to hold his urine before, sleep better with less interrupted trips to the urination             [facility] PT Assessment - 09/17/16 1039      Other:   Other/ Comments modified side plank on chair, propped on R forearms, demo'd low endurance,  downgraded to higher platform, and pt showed less difficulty.   Reverse plank/bridge:  required cue for proepr palm activation to minimize wrist strain, and alignment of feet                                   PT Long Term Goals - 08/31/16 1019      PT LONG TERM GOAL #1   Title Pt will decrease getting up at night from 9-10 x/ night to < 4x/ night in order to improve sleep. Pt will also use his CPAP machine nighlt  (5/23: 6x/ night)    Time 12   Period Weeks   Status Achieved     PT LONG TERM GOAL #2  Title Pt will report decreased pain from 7/10 to < 2/10 at the end of uriation across 5 episodes    Time 8   Period Weeks   Status Achieved     PT LONG TERM GOAL #3   Title Pt will report being able to sit for > 30 min  in order to sit through community events    Baseline 10 min before pain onset    Time 8   Period Weeks   Status Achieved     PT LONG TERM GOAL #4   Title Pt will report being able to sense the urge to urinate instead of the pain signal as the urge to urinate in order to sleep    Time 12   Period Weeks   Status Achieved     PT LONG TERM GOAL #5   Title Pt will decrease his IPPS score from 35 pts severe to < 19 pts ( moderate) and 6 pts in QOL score to < 3 pts in order to improve function and QOL  ( 8/14: 20 lower range of severe, 6 pts )     Time 12   Period Weeks   Status Partially Met      PT LONG TERM GOAL #6   Title Pt will demo improved arthrokinematics mobility at SIJ B across 2 visits in order to progress to upright loaded deep core / thoracolumbar strengthening to not leak with pertubation movements in ADLs   Time 12   Period Weeks   Status Achieved     PT LONG TERM GOAL #7   Title Pt will demo proper alignemnt and coordination technique with weight lifting routine in order to minimzie relapse of Sx    Time 12   Period Weeks   Status Partially Met               Plan - 09/17/16 1043    Clinical Impression Statement Pt reports he is sleeping more as nocturia has decreased to 2x/ night.  Pt has undergone a sleep study and will be following up with MD on results and CPAP settings. Pt reports minor soreness and no more pelvic pain. Pt was progressed to endurance pelvic floor training 3 sec, 5 reps x 5 x day in seated position. Pt also required cuing with fitness exercises and provided modifications to minimize poor technique and strain on pelvic floor mm. Pt was educated to withhold weight lifting with BUE until his BP has been addressed with his medical providers. Post fitness exercises today, pt's BP 180/100 mm Hg. Pre exercise 155/98 mm Hg.  Pt was provided body weight training exercises to promote scapular stabilizing mm and thoracolumbar strengthening. Pt continues to benefit from skilled PT.    Rehab Potential Good   PT Frequency weekly   PT Duration 12 weeks   PT Treatment/Interventions Neuromuscular re-education;Therapeutic exercise;Therapeutic activities;Moist Heat;Energy conservation;Patient/family education;Scar mobilization;Gait training;Stair training;Functional mobility training   Consulted and Agree with Plan of Care Patient      Patient will benefit from skilled therapeutic intervention in order to improve the following deficits and impairments:  Decreased coordination, Decreased safety awareness, Decreased strength, Decreased mobility, Decreased  endurance, Hypomobility, Decreased range of motion, Postural dysfunction, Pain  Visit Diagnosis: Other lack of coordination  Myalgia  Muscle weakness (generalized)     Problem List Patient Active Problem List   Diagnosis Date Noted  . SUI (stress urinary incontinence), male 08/11/2016  . Erectile dysfunction after radical prostatectomy 08/11/2016  . Prostate  cancer ([place]) 02/24/2016  . Urinary urgency 02/24/2016  . Chest pain 01/24/2016  . NSTEMI (non-ST elevated myocardial infarction) ([place]) 01/24/2016  . Urinary frequency 12/30/2015  . Dyslipidemia 11/27/2015  . Essential hypertension 11/27/2015  . Hyperlipidemia 11/27/2015  . Hypertension 11/27/2015  . Snoring 11/27/2015  . Colon adenoma 01/21/2012    Jerl Mina ,PT, DPT, E-RYT  09/17/2016, 10:52 AM  [place] MAIN [facility] SERVICES [address] [place], [place], [address] Phone: [phone]   Fax:  [phone]  Name: Garrett Watkins MRN: [id number] Date of Birth: Sep 17, 1960

## 2016-10-05 ENCOUNTER — Ambulatory Visit: Payer: BLUE CROSS/BLUE SHIELD | Admitting: Physical Therapy

## 2016-10-05 DIAGNOSIS — G4730 Sleep apnea, unspecified: Secondary | ICD-10-CM | POA: Diagnosis not present

## 2016-10-05 DIAGNOSIS — I10 Essential (primary) hypertension: Secondary | ICD-10-CM | POA: Diagnosis not present

## 2016-10-05 DIAGNOSIS — G4733 Obstructive sleep apnea (adult) (pediatric): Secondary | ICD-10-CM | POA: Diagnosis not present

## 2016-10-08 DIAGNOSIS — G4733 Obstructive sleep apnea (adult) (pediatric): Secondary | ICD-10-CM | POA: Diagnosis not present

## 2016-10-21 ENCOUNTER — Other Ambulatory Visit (INDEPENDENT_AMBULATORY_CARE_PROVIDER_SITE_OTHER): Payer: BLUE CROSS/BLUE SHIELD

## 2016-10-21 ENCOUNTER — Other Ambulatory Visit (INDEPENDENT_AMBULATORY_CARE_PROVIDER_SITE_OTHER): Payer: Self-pay | Admitting: Vascular Surgery

## 2016-10-21 DIAGNOSIS — I10 Essential (primary) hypertension: Secondary | ICD-10-CM | POA: Diagnosis not present

## 2016-10-29 ENCOUNTER — Ambulatory Visit: Payer: BLUE CROSS/BLUE SHIELD | Attending: Urology | Admitting: Physical Therapy

## 2016-10-29 DIAGNOSIS — R278 Other lack of coordination: Secondary | ICD-10-CM | POA: Diagnosis not present

## 2016-10-29 DIAGNOSIS — M6281 Muscle weakness (generalized): Secondary | ICD-10-CM | POA: Insufficient documentation

## 2016-10-29 DIAGNOSIS — M791 Myalgia, unspecified site: Secondary | ICD-10-CM | POA: Insufficient documentation

## 2016-10-29 NOTE — Therapy (Signed)
[place] MAIN [facility] SERVICES [address] [place], [place], [address] Phone: [phone]   Fax:  [phone]  Physical Therapy Treatment / Discharge Summary   Patient Details  Name: Garrett Watkins MRN: [id number] Date of Birth: 11/07/1960 Referring Provider: Tresa Moore  Encounter Date: 10/29/2016      PT End of Session - 10/29/16 0901    Visit Number 13   Date for PT Re-Evaluation 11/18/16   PT Start Time 0810   PT Stop Time 0856   PT Time Calculation (min) 46 min   Activity Tolerance Patient tolerated treatment well;No increased pain   Behavior During Therapy WFL for tasks assessed/performed      Past Medical History:  Diagnosis Date  . BPH (benign prostatic hyperplasia)   . Colon adenoma   . Dyslipidemia   . GERD (gastroesophageal reflux disease)    history of  . Hypertension   . OSA (obstructive sleep apnea)   . Prostate cancer [facility])     Past Surgical History:  Procedure Laterality Date  . CARDIAC CATHETERIZATION Right 01/24/2016   Procedure: Left Heart Cath and Coronary Angiography;  Surgeon: Corey Skains, MD;  Location: [place] [facility];  Service: Cardiovascular;  Laterality: Right;  . CYST EXCISION    . LYMPHADENECTOMY Bilateral 04/21/2016   Procedure: PELVIC LYMPHADENECTOMY;  Surgeon: Alexis Frock, MD;  Location: [facility] ORS;  Service: Urology;  Laterality: Bilateral;  . NO PAST SURGERIES    . ROBOT ASSISTED LAPAROSCOPIC RADICAL PROSTATECTOMY N/A 04/21/2016   Procedure: ROBOTIC ASSISTED LAPAROSCOPIC RADICAL PROSTATECTOMY;  Surgeon: Alexis Frock, MD;  Location: [facility] ORS;  Service: Urology;  Laterality: N/A;  . VASECTOMY      There were no vitals filed for this visit.      Subjective Assessment - 10/29/16 0813    Subjective Pt reported he has been working with a kidney doctor who is helping to stabilize his blood pressure. Pt has a f/u visit re: adrenal issues that was found at his last visit. Pt has stopped all  exercises until his BP gets back to normal even though his doctor had not restricted him . Pt is not waking up to go to th e bathroom. Pt is much better than he was with incontinence but he wishes he could better by this 6 month period.  If he is sitting and drinking a 16 fl oz water , then he goes 2x in 3 hours.  If he is active away from toilets , he is able to hold it for longer. Pain in the pelvic area is very slight and occurs before and after going to the bathroom and eases quickly.  Pt is down to 1 pad/day ( medium pad for the past 2 months)     Pertinent History Prostate CA, physical active, enjoys working out includes sit ups and crunches   Patient Stated Goals be normal again, go to the bathroom and be able to hold his urine before, sleep better with less interrupted trips to the urination                          [facility] Adult PT Treatment/Exercise - 10/29/16 1249      Therapeutic Activites    Therapeutic Activities --  provided resources and reassessed goals                      PT Long Term Goals - 10/29/16 9833  PT LONG TERM GOAL #1   Title Pt will decrease getting up at night from 9-10 x/ night to < 4x/ night in order to improve sleep. Pt will also use his CPAP machine nighlt  (5/23: 6x/ night) (10/18: 1-2x/ night)    Time 12   Period Weeks   Status Achieved     PT LONG TERM GOAL #2   Title Pt will report decreased pain from 7/10 to < 2/10 at the end of uriation across 5 episodes    Time 8   Period Weeks   Status Achieved     PT LONG TERM GOAL #3   Title Pt will report being able to sit for > 30 min  in order to sit through community events    Baseline 10 min before pain onset    Time 8   Period Weeks   Status Achieved     PT LONG TERM GOAL #4   Title Pt will report being able to sense the urge to urinate instead of the pain signal as the urge to urinate in order to sleep    Time 12   Period Weeks   Status Achieved     PT LONG  TERM GOAL #5   Title Pt will decrease his IPPS score from 35 pts severe to < 19 pts ( moderate) and 6 pts in QOL score to < 3 pts in order to improve function and QOL  ( 8/14: 20 lower range of severe, 6 pts  )  ( 10/18: 10 pts , QOL 2pt)    Time 12   Period Weeks   Status Achieved     PT LONG TERM GOAL #6   Title Pt will demo improved arthrokinematics mobility at SIJ B across 2 visits in order to progress to upright loaded deep core / thoracolumbar strengthening to not leak with pertubation movements in ADLs   Time 12   Period Weeks   Status Achieved     PT LONG TERM GOAL #7   Title Pt will demo proper alignemnt and coordination technique with weight lifting routine in order to minimzie relapse of Sx    Time 12   Period Weeks   Status Achieved               Plan - 10/29/16 0901    Clinical Impression Statement Pt has achieved 100% of all of his goals. Pt's IPPS score improved from 35 pts to 10 pts which indicate significant improvement with continence. Pt's QOL score also improved from 6 pts to 2 pts. Pt 's nocturia episodes decreased from 9-10 x/ night to 1-2x/ night. Pt's pelvic pain and associated scar restrictions and pelvic floor mm tightness have improved significantly. Pt is also working on improving urgency and has been able to retrain his trips to the bathroom to 2x/ every 3.5 hours or with activities" 1x/ every 2 hours. Pt continues to work with his medical doctors to manage his BP. Pt plans to return to exercise once his BP stabilizes. Pt has been educated on proper technique and alignment with weight lifting to minimize relapse of Sx and potential injuries. Pt was provided research, articles, and a referral to sexual therapist because pt's remaining concern is ED s/p post-prostatectomy  which has impacted him emotionally. Pt was encouraged to continue progressing with his pelvic floor strengthening program. Pt voiced understanding. Pt is ready for d/c at this time. Thank you  for your referral.     Rehab Potential  Good   PT Frequency Biweekly   PT Duration 12 weeks   PT Treatment/Interventions Neuromuscular re-education;Therapeutic exercise;Therapeutic activities;Moist Heat;Energy conservation;Patient/family education;Scar mobilization;Gait training;Stair training;Functional mobility training   Consulted and Agree with Plan of Care Patient      Patient will benefit from skilled therapeutic intervention in order to improve the following deficits and impairments:  Decreased coordination, Decreased safety awareness, Decreased strength, Decreased mobility, Decreased endurance, Hypomobility, Decreased range of motion, Postural dysfunction, Pain  Visit Diagnosis: Other lack of coordination  Myalgia  Muscle weakness (generalized)     Problem List Patient Active Problem List   Diagnosis Date Noted  . SUI (stress urinary incontinence), male 08/11/2016  . Erectile dysfunction after radical prostatectomy 08/11/2016  . Prostate cancer ([place]) 02/24/2016  . Urinary urgency 02/24/2016  . Chest pain 01/24/2016  . NSTEMI (non-ST elevated myocardial infarction) ([place]) 01/24/2016  . Urinary frequency 12/30/2015  . Dyslipidemia 11/27/2015  . Essential hypertension 11/27/2015  . Hyperlipidemia 11/27/2015  . Hypertension 11/27/2015  . Snoring 11/27/2015  . Colon adenoma 01/21/2012    Jerl Mina ,PT, DPT, E-RYT  10/29/2016, 1:57 PM  [place] MAIN [facility] SERVICES [address] [place], [place], [address] Phone: [phone]   Fax:  [phone]  Name: Garrett Watkins MRN: [id number] Date of Birth: 26-Sep-1960

## 2016-10-29 NOTE — Therapy (Deleted)
[place] MAIN [facility] SERVICES [address] [place], [place], [address] Phone: [phone]   Fax:  [phone]  Patient Details  Name: Drevon Plog MRN: [id number] Date of Birth: 04-Dec-1960 Referring Provider:  Kirk Ruths, MD  Encounter Date: 10/29/2016   Jerl Mina 10/29/2016, 8:58 AM  [place] MAIN [facility] SERVICES [address] [place], [place], [address] Phone: [phone]   Fax:  [phone]

## 2016-11-04 DIAGNOSIS — I10 Essential (primary) hypertension: Secondary | ICD-10-CM | POA: Diagnosis not present

## 2016-11-04 DIAGNOSIS — G4733 Obstructive sleep apnea (adult) (pediatric): Secondary | ICD-10-CM | POA: Diagnosis not present

## 2016-11-07 DIAGNOSIS — G4733 Obstructive sleep apnea (adult) (pediatric): Secondary | ICD-10-CM | POA: Diagnosis not present

## 2016-11-12 ENCOUNTER — Ambulatory Visit: Payer: BLUE CROSS/BLUE SHIELD | Admitting: Physical Therapy

## 2016-11-25 DIAGNOSIS — G4733 Obstructive sleep apnea (adult) (pediatric): Secondary | ICD-10-CM | POA: Diagnosis not present

## 2016-11-25 DIAGNOSIS — Z9989 Dependence on other enabling machines and devices: Secondary | ICD-10-CM | POA: Diagnosis not present

## 2016-11-25 DIAGNOSIS — I10 Essential (primary) hypertension: Secondary | ICD-10-CM | POA: Diagnosis not present

## 2016-11-26 ENCOUNTER — Ambulatory Visit (INDEPENDENT_AMBULATORY_CARE_PROVIDER_SITE_OTHER): Payer: BLUE CROSS/BLUE SHIELD | Admitting: Endocrinology

## 2016-11-26 ENCOUNTER — Encounter: Payer: Self-pay | Admitting: Endocrinology

## 2016-11-26 VITALS — BP 152/92 | HR 58 | Wt 235.6 lb

## 2016-11-26 DIAGNOSIS — R7989 Other specified abnormal findings of blood chemistry: Secondary | ICD-10-CM | POA: Insufficient documentation

## 2016-11-26 DIAGNOSIS — I10 Essential (primary) hypertension: Secondary | ICD-10-CM

## 2016-11-26 DIAGNOSIS — R799 Abnormal finding of blood chemistry, unspecified: Secondary | ICD-10-CM | POA: Diagnosis not present

## 2016-11-26 LAB — BASIC METABOLIC PANEL
BUN: 14 mg/dL (ref 6–23)
CO2: 33 meq/L — AB (ref 19–32)
Calcium: 9.9 mg/dL (ref 8.4–10.5)
Chloride: 101 mEq/L (ref 96–112)
Creatinine, Ser: 1.02 mg/dL (ref 0.40–1.50)
GFR: 97.07 mL/min (ref >60.00)
GLUCOSE: 91 mg/dL (ref 70–99)
POTASSIUM: 3.8 meq/L (ref 3.5–5.1)
SODIUM: 139 meq/L (ref 135–145)

## 2016-11-26 MED ORDER — EPLERENONE 25 MG PO TABS: 25.0000 mg | ORAL_TABLET | Freq: Every day | ORAL | 11 refills | Status: DC

## 2016-11-26 NOTE — Patient Instructions (Signed)
I have sent a prescription to your pharmacy, for the blood pressure blood tests, and a 24-HR urine test, are requested for you today.  We'll let you know about the results. We will need to take this complex situation in stages.   Please come back for a follow-up appointment in 2-3 weeks.

## 2016-11-26 NOTE — Progress Notes (Signed)
Subjective:    Patient ID: Garrett Watkins, male    DOB: 08-Apr-1960, 56 y.o.   MRN: [id number]  HPI Pt is ref by Dr Holley Raring, for elev aldo/PRA ratio.  HTN was dx'ed in 2008.  Hypokalemia was first noted in 2018.  He has no h/o adrenal disease or kidney problems.  In dec of 2017, he had been in the hospital for severe HTN.  When he got home, he had an episode of excessive diaphoresis throughout the body, and assoc syncope.  Past Medical History:  Diagnosis Date  . BPH (benign prostatic hyperplasia)   . Colon adenoma   . Dyslipidemia   . GERD (gastroesophageal reflux disease)    history of  . Hypertension   . OSA (obstructive sleep apnea)   . Prostate cancer [facility])     Past Surgical History:  Procedure Laterality Date  . CYST EXCISION    . Left Heart Cath and Coronary Angiography Right 01/24/2016   Performed by Corey Skains, MD at [place] [facility]  . NO PAST SURGERIES    . PELVIC LYMPHADENECTOMY Bilateral 04/21/2016   Performed by Alexis Frock, MD at [facility]  . ROBOTIC ASSISTED LAPAROSCOPIC RADICAL PROSTATECTOMY N/A 04/21/2016   Performed by Alexis Frock, MD at [facility] ORS  . VASECTOMY      Social History   Socioeconomic History  . Marital status: Married    Spouse name: Not on file  . Number of children: Not on file  . Years of education: Not on file  . Highest education level: Not on file  Social Needs  . Financial resource strain: Not on file  . Food insecurity - worry: Not on file  . Food insecurity - inability: Not on file  . Transportation needs - medical: Not on file  . Transportation needs - non-medical: Not on file  Occupational History  . Not on file  Tobacco Use  . Smoking status: Never Smoker  . Smokeless tobacco: Never Used  Substance and Sexual Activity  . Alcohol use: No  . Drug use: No  . Sexual activity: Not on file  Other Topics Concern  . Not on file  Social History Narrative  . Not on file    Current Outpatient Medications on File  Prior to Visit  Medication Sig Dispense Refill  . carvedilol (COREG) 12.5 MG tablet Take 12.5 mg daily by mouth.    . hydrochlorothiazide (HYDRODIURIL) 25 MG tablet Take 25 mg daily by mouth.    . telmisartan (MICARDIS) 80 MG tablet Take 80 mg daily by mouth.    Marland Kitchen amLODipine (NORVASC) 5 MG tablet Take 5 mg daily by mouth.  11  . hydrALAZINE (APRESOLINE) 50 MG tablet Take 50 mg 3 (three) times daily by mouth.    Marland Kitchen ibuprofen (ADVIL,MOTRIN) 200 MG tablet Take 200 mg by mouth every 6 (six) hours as needed.    . Multiple Vitamin (MULTIVITAMIN WITH MINERALS) TABS tablet Take 1 tablet by mouth daily.    Marland Kitchen TONALIN SAFFLOWER OIL CLA PO Take 800 mg by mouth 2 (two) times daily.     No current facility-administered medications on file prior to visit.     Allergies  Allergen Reactions  . Amlodipine Other (See Comments)    Swelling in ankles (edema)---with higher doses per patient.    Family History  Problem Relation Age of Onset  . CAD Mother   . Prostate cancer Neg Hx   . Adrenal disorder Neg Hx  BP (!) 152/92 (BP Location: Left Arm, Patient Position: Sitting, Cuff Size: Normal)   Pulse (!) 58   Wt 235 lb 9.6 oz (106.9 kg)   SpO2 98%   BMI 31.08 kg/m     Review of Systems He denies polyuria, numbness, muscle cramps, n/v, headache, blurry vision, weight change, anxiety, muscle weakness, flushing, syncope, diarrhea, weight loss, chest pain, sob, visual loss, leg edema, fever, muscle weakness, rhinorrhea, or dry skin.  He has rhinorrhea.      Objective:   Physical Exam VS: see vs page GEN: no distress HEAD: head: no deformity eyes: no periorbital swelling, no proptosis external nose and ears are normal mouth: no lesion seen NECK: supple, thyroid is not enlarged CHEST WALL: no deformity LUNGS: clear to auscultation CV: reg rate and rhythm, no murmur ABD: abdomen is soft, nontender.  no hepatosplenomegaly.  not distended.  no hernia MUSCULOSKELETAL: muscle bulk and strength  are grossly normal.  no obvious joint swelling.  gait is normal and steady EXTEMITIES: no deformity.  no edema PULSES: no carotid bruit NEURO:  cn 2-12 grossly intact.   readily moves all 4's.  sensation is intact to touch on all 4's SKIN:  Normal texture and temperature.  No rash or suspicious lesion is visible.   NODES:  None palpable at the neck PSYCH: alert, well-oriented.  Does not appear anxious nor depressed.    Pt signs release of information, for CT of pelvis.   outside test results are reviewed: Aldosterone=10 PRA=undetectable Cortisol=8    Assessment & Plan:  elev aldo/PRA ratio, new.   HTN: severe: we'll check urine catechols.    Patient Instructions  I have sent a prescription to your pharmacy, for the blood pressure blood tests, and a 24-HR urine test, are requested for you today.  We'll let you know about the results. We will need to take this complex situation in stages.   Please come back for a follow-up appointment in 2-3 weeks.

## 2016-11-30 DIAGNOSIS — I10 Essential (primary) hypertension: Secondary | ICD-10-CM | POA: Diagnosis not present

## 2016-11-30 NOTE — Addendum Note (Signed)
Addended by: Kaylyn Lim I on: 11/30/2016 09:02 AM   Modules accepted: Orders

## 2016-12-02 LAB — ALDOSTERONE + RENIN ACTIVITY W/ RATIO
ALDO / PRA RATIO: 38.5 ratio — AB (ref 0.9–28.9)
ALDOSTERONE, SERUM: 5 ng/dL
RENIN ACTIVITY: 0.13 ng/mL/h — AB (ref 0.25–5.82)

## 2016-12-05 LAB — CATECHOLAMINES, FRACTIONATED, URINE, 24 HOUR
CALCULATED TOTAL (E+ NE): 49 ug/(24.h) (ref 26–121)
Creatinine, Urine mg/day-CATEUR: 2.45 g/(24.h) — ABNORMAL HIGH (ref 0.50–2.15)
Dopamine, 24 hr Urine: 395 mcg/24 h (ref 52–480)
Norepinephrine, 24 hr Ur: 49 mcg/24 h (ref 15–100)
Volume, Urine-VMAUR: 1520 mL

## 2016-12-05 LAB — METANEPHRINES, URINE, 24 HOUR
METANEPH TOTAL UR: 673 ug/(24.h) (ref 224–832)
Metanephrines, Ur: 216 mcg/24 h (ref 90–315)
NORMETANEPHRINE 24H UR: 457 ug/(24.h) (ref 122–676)
VOLUME, URINE-VMAUR: 1520 mL

## 2016-12-08 DIAGNOSIS — G4733 Obstructive sleep apnea (adult) (pediatric): Secondary | ICD-10-CM | POA: Diagnosis not present

## 2016-12-17 ENCOUNTER — Ambulatory Visit: Payer: BLUE CROSS/BLUE SHIELD | Admitting: Endocrinology

## 2016-12-17 ENCOUNTER — Encounter: Payer: Self-pay | Admitting: Endocrinology

## 2016-12-17 VITALS — BP 138/78 | HR 71 | Wt 235.2 lb

## 2016-12-17 DIAGNOSIS — I10 Essential (primary) hypertension: Secondary | ICD-10-CM

## 2016-12-17 DIAGNOSIS — R799 Abnormal finding of blood chemistry, unspecified: Secondary | ICD-10-CM

## 2016-12-17 DIAGNOSIS — R7989 Other specified abnormal findings of blood chemistry: Secondary | ICD-10-CM

## 2016-12-17 LAB — BASIC METABOLIC PANEL
BUN: 13 mg/dL (ref 6–23)
CALCIUM: 9.2 mg/dL (ref 8.4–10.5)
CO2: 32 mEq/L (ref 19–32)
Chloride: 99 mEq/L (ref 96–112)
Creatinine, Ser: 1.02 mg/dL (ref 0.40–1.50)
GFR: 97.05 mL/min (ref >60.00)
GLUCOSE: 90 mg/dL (ref 70–99)
Potassium: 3.8 mEq/L (ref 3.5–5.1)
SODIUM: 138 meq/L (ref 135–145)

## 2016-12-17 MED ORDER — EPLERENONE 25 MG PO TABS: 25.0000 mg | ORAL_TABLET | Freq: Two times a day (BID) | ORAL | 11 refills | Status: DC

## 2016-12-17 MED ORDER — TELMISARTAN 40 MG PO TABS: 40.0000 mg | ORAL_TABLET | Freq: Every day | ORAL | 5 refills | Status: DC

## 2016-12-17 NOTE — Patient Instructions (Addendum)
Please half the telmisartan, and double the eplerenone Let's check the CT.  you will receive a phone call, about a day and time for an appointment. Please call if your blood pressure is low, so we can stop the telmisartan.  Please come back for a follow-up appointment in 1 month.

## 2016-12-17 NOTE — Progress Notes (Signed)
Subjective:    Patient ID: Garrett Watkins, male    DOB: 1960/06/10, 57 y.o.   MRN: [id number]  HPI Pt returns for f/u of elev aldo/PRA ratio (HTN was dx'ed in 2008; hypokalemia was first noted in 2018; in Dec of 2017, he was in the hospital for severe HTN; he was rxed eplerenone in Nov of 2018).  Denies sob Past Medical History:  Diagnosis Date  . BPH (benign prostatic hyperplasia)   . Colon adenoma   . Dyslipidemia   . GERD (gastroesophageal reflux disease)    history of  . Hypertension   . OSA (obstructive sleep apnea)   . Prostate cancer [facility])     Past Surgical History:  Procedure Laterality Date  . CARDIAC CATHETERIZATION Right 01/24/2016   Procedure: Left Heart Cath and Coronary Angiography;  Surgeon: Corey Skains, MD;  Location: [place] [facility];  Service: Cardiovascular;  Laterality: Right;  . CYST EXCISION    . LYMPHADENECTOMY Bilateral 04/21/2016   Procedure: PELVIC LYMPHADENECTOMY;  Surgeon: Alexis Frock, MD;  Location: [facility];  Service: Urology;  Laterality: Bilateral;  . NO PAST SURGERIES    . ROBOT ASSISTED LAPAROSCOPIC RADICAL PROSTATECTOMY N/A 04/21/2016   Procedure: ROBOTIC ASSISTED LAPAROSCOPIC RADICAL PROSTATECTOMY;  Surgeon: Alexis Frock, MD;  Location: [facility];  Service: Urology;  Laterality: N/A;  . VASECTOMY      Social History   Socioeconomic History  . Marital status: Married    Spouse name: Not on file  . Number of children: Not on file  . Years of education: Not on file  . Highest education level: Not on file  Social Needs  . Financial resource strain: Not on file  . Food insecurity - worry: Not on file  . Food insecurity - inability: Not on file  . Transportation needs - medical: Not on file  . Transportation needs - non-medical: Not on file  Occupational History  . Not on file  Tobacco Use  . Smoking status: Never Smoker  . Smokeless tobacco: Never Used  Substance and Sexual Activity  . Alcohol use: No  . Drug use: No  .  Sexual activity: Not on file  Other Topics Concern  . Not on file  Social History Narrative  . Not on file    Current Outpatient Medications on File Prior to Visit  Medication Sig Dispense Refill  . amLODipine (NORVASC) 5 MG tablet Take 5 mg daily by mouth.  11  . carvedilol (COREG) 12.5 MG tablet Take 12.5 mg daily by mouth.    . hydrALAZINE (APRESOLINE) 50 MG tablet Take 50 mg 3 (three) times daily by mouth.    . hydrochlorothiazide (HYDRODIURIL) 25 MG tablet Take 25 mg daily by mouth.    Marland Kitchen ibuprofen (ADVIL,MOTRIN) 200 MG tablet Take 200 mg by mouth every 6 (six) hours as needed.    . Multiple Vitamin (MULTIVITAMIN WITH MINERALS) TABS tablet Take 1 tablet by mouth daily.    Marland Kitchen TONALIN SAFFLOWER OIL CLA PO Take 800 mg by mouth 2 (two) times daily.     No current facility-administered medications on file prior to visit.     Allergies  Allergen Reactions  . Amlodipine Other (See Comments)    Swelling in ankles (edema)---with higher doses per patient.    Family History  Problem Relation Age of Onset  . CAD Mother   . Prostate cancer Neg Hx   . Adrenal disorder Neg Hx     BP 138/78 (BP Location: Left Arm, Patient  Position: Sitting, Cuff Size: Normal)   Pulse 71   Wt 235 lb 3.2 oz (106.7 kg)   SpO2 97%   BMI 31.03 kg/m    Review of Systems Denies leg edema    Objective:   Physical Exam VITAL SIGNS:  See vs page GENERAL: no distress LUNGS:  Clear to auscultation      Assessment & Plan:  Low-renin HTN, with elev aldo/PRA ratio.  htn is improved on eplerenone  Patient Instructions  Please half the telmisartan, and double the eplerenone Let's check the CT.  you will receive a phone call, about a day and time for an appointment. Please call if your blood pressure is low, so we can stop the telmisartan.  Please come back for a follow-up appointment in 1 month.

## 2016-12-24 ENCOUNTER — Telehealth: Payer: Self-pay | Admitting: Endocrinology

## 2016-12-24 DIAGNOSIS — R7989 Other specified abnormal findings of blood chemistry: Secondary | ICD-10-CM

## 2016-12-24 DIAGNOSIS — R799 Abnormal finding of blood chemistry, unspecified: Principal | ICD-10-CM

## 2016-12-24 NOTE — Telephone Encounter (Signed)
Pt wants to know who he can call to expedite scheduling CT scan in [facility] area before year end before his deductible starts over.

## 2016-12-24 NOTE — Telephone Encounter (Signed)
Please forward this message to [facility] 

## 2016-12-25 NOTE — Telephone Encounter (Signed)
° °  [facility] care called, pt needing CT done and she stated the order wasn't correct   Please call  [facility] @ hear tcare  [phone]

## 2016-12-25 NOTE — Telephone Encounter (Signed)
What number do they need?

## 2016-12-29 ENCOUNTER — Telehealth: Payer: Self-pay | Admitting: Endocrinology

## 2016-12-29 NOTE — Telephone Encounter (Signed)
Attempted to reach [place] at the number listed below. The mailbox was full and Erline Levine was not available.

## 2016-12-29 NOTE — Telephone Encounter (Signed)
Patient wants to be scheduled for CT scan per Dr Loanne Drilling before the end of the month. Please call patient at ph# [phone] to update him on status of CT scan appt-2nd time he has called

## 2016-12-29 NOTE — Telephone Encounter (Signed)
Please refer message to [facility]

## 2016-12-30 NOTE — Addendum Note (Signed)
Addended by: Renato Shin on: 12/30/2016 10:50 AM   Modules accepted: Orders

## 2016-12-30 NOTE — Telephone Encounter (Signed)
Ok, I ordered 

## 2016-12-30 NOTE — Telephone Encounter (Signed)
I spoke [employer] & she stated that they usually do the CT scan orders with or without contrast. Based on what the Radiologist sees they decide if contrast is needed. She said that patient wanted to be scheduled at [facility] & she was unsure if they needed the order entered for with or without contrast as they prefer. Marzetta Board also referred patient & sent message to [employer]. My question was did you want to reorder CT for the with our without contrast? I will also forward this message to [facility].

## 2016-12-31 ENCOUNTER — Telehealth: Payer: Self-pay | Admitting: Endocrinology

## 2016-12-31 DIAGNOSIS — G4733 Obstructive sleep apnea (adult) (pediatric): Secondary | ICD-10-CM | POA: Diagnosis not present

## 2016-12-31 DIAGNOSIS — R799 Abnormal finding of blood chemistry, unspecified: Principal | ICD-10-CM

## 2016-12-31 DIAGNOSIS — R7989 Other specified abnormal findings of blood chemistry: Secondary | ICD-10-CM

## 2016-12-31 DIAGNOSIS — E2609 Other primary hyperaldosteronism: Secondary | ICD-10-CM | POA: Diagnosis not present

## 2016-12-31 DIAGNOSIS — I10 Essential (primary) hypertension: Secondary | ICD-10-CM | POA: Diagnosis not present

## 2016-12-31 NOTE — Telephone Encounter (Signed)
Patient stated no has called him to set up his ct scan.  Patient need the phone number and address of the place he is getting his ct scan please advise

## 2016-12-31 NOTE — Telephone Encounter (Signed)
Any place is fine with me.

## 2017-01-01 NOTE — Telephone Encounter (Signed)
I am assuming Nonah Mattes isn't taking care of out going referrals or scheduling any longer. I called [facility] to try to get patient scheduled with order that was already placed. When I called I as told that order is still in wrong that it needs to be put in as CT of abdomen with or with out contrast. She gave me number to radiologist if provider needs to call to get correct order in [phone]. Please advise?

## 2017-01-01 NOTE — Telephone Encounter (Signed)
done

## 2017-01-01 NOTE — Telephone Encounter (Signed)
I called and made appt for patient at heart care for 12/27 @ 4pm for CT scan.

## 2017-01-01 NOTE — Telephone Encounter (Signed)
Pt called again about CT scan.  Wanting to speak with someone about it. Needing to know if he will get a call to set up his scan   Please advise

## 2017-01-07 ENCOUNTER — Other Ambulatory Visit: Payer: Self-pay | Admitting: Endocrinology

## 2017-01-07 ENCOUNTER — Ambulatory Visit (INDEPENDENT_AMBULATORY_CARE_PROVIDER_SITE_OTHER)
Admission: RE | Admit: 2017-01-07 | Discharge: 2017-01-07 | Disposition: A | Discharge status: Home / Self Care | Payer: BLUE CROSS/BLUE SHIELD | Source: Ambulatory Visit | Attending: Endocrinology | Admitting: Endocrinology

## 2017-01-07 DIAGNOSIS — R799 Abnormal finding of blood chemistry, unspecified: Secondary | ICD-10-CM

## 2017-01-07 DIAGNOSIS — R7989 Other specified abnormal findings of blood chemistry: Secondary | ICD-10-CM

## 2017-01-07 DIAGNOSIS — E269 Hyperaldosteronism, unspecified: Secondary | ICD-10-CM | POA: Diagnosis not present

## 2017-01-19 ENCOUNTER — Ambulatory Visit: Payer: BLUE CROSS/BLUE SHIELD | Admitting: Endocrinology

## 2017-01-19 ENCOUNTER — Encounter: Payer: Self-pay | Admitting: Endocrinology

## 2017-01-19 VITALS — BP 128/80 | HR 69 | Wt 234.8 lb

## 2017-01-19 DIAGNOSIS — R799 Abnormal finding of blood chemistry, unspecified: Secondary | ICD-10-CM

## 2017-01-19 DIAGNOSIS — R7989 Other specified abnormal findings of blood chemistry: Secondary | ICD-10-CM

## 2017-01-19 LAB — BASIC METABOLIC PANEL
BUN: 13 mg/dL (ref 6–23)
CALCIUM: 9.6 mg/dL (ref 8.4–10.5)
CO2: 33 mEq/L — ABNORMAL HIGH (ref 19–32)
CREATININE: 1.04 mg/dL (ref 0.40–1.50)
Chloride: 99 mEq/L (ref 96–112)
GFR: 94.87 mL/min (ref >60.00)
GLUCOSE: 100 mg/dL — AB (ref 70–99)
Potassium: 3.7 mEq/L (ref 3.5–5.1)
SODIUM: 138 meq/L (ref 135–145)

## 2017-01-19 MED ORDER — EPLERENONE 50 MG PO TABS: 50.0000 mg | ORAL_TABLET | Freq: Two times a day (BID) | ORAL | 11 refills | Status: DC

## 2017-01-19 MED ORDER — TELMISARTAN 40 MG PO TABS: 40.0000 mg | ORAL_TABLET | Freq: Every day | ORAL | 5 refills | Status: DC

## 2017-01-19 MED ORDER — CARVEDILOL 6.25 MG PO TABS: 6.2500 mg | ORAL_TABLET | Freq: Two times a day (BID) | ORAL | 11 refills | Status: DC

## 2017-01-19 NOTE — Patient Instructions (Addendum)
Please half the carvedilol, and double the eplerenone.   blood tests are requested for you today.  We'll let you know about the results.   Please come back for a follow-up appointment in 2 months.

## 2017-01-19 NOTE — Progress Notes (Signed)
Subjective:    Patient ID: Garrett Watkins, male    DOB: Oct 27, 1960, 57 y.o.   MRN: [id number]  HPI Pt returns for f/u of elev aldo/PRA ratio (HTN was dx'ed in 2008; hypokalemia was first noted in 2018; in Dec of 2017, he was in the hospital for severe HTN; he was rxed eplerenone in Nov of 2018; adrenals were normal on 12/18 CT).  He has ED sxs since prostate surgery.   Past Medical History:  Diagnosis Date  . BPH (benign prostatic hyperplasia)   . Colon adenoma   . Dyslipidemia   . GERD (gastroesophageal reflux disease)    history of  . Hypertension   . OSA (obstructive sleep apnea)   . Prostate cancer [facility])     Past Surgical History:  Procedure Laterality Date  . CARDIAC CATHETERIZATION Right 01/24/2016   Procedure: Left Heart Cath and Coronary Angiography;  Surgeon: Corey Skains, MD;  Location: [place] [facility];  Service: Cardiovascular;  Laterality: Right;  . CYST EXCISION    . LYMPHADENECTOMY Bilateral 04/21/2016   Procedure: PELVIC LYMPHADENECTOMY;  Surgeon: Alexis Frock, MD;  Location: [facility];  Service: Urology;  Laterality: Bilateral;  . NO PAST SURGERIES    . ROBOT ASSISTED LAPAROSCOPIC RADICAL PROSTATECTOMY N/A 04/21/2016   Procedure: ROBOTIC ASSISTED LAPAROSCOPIC RADICAL PROSTATECTOMY;  Surgeon: Alexis Frock, MD;  Location: [facility];  Service: Urology;  Laterality: N/A;  . VASECTOMY      Social History   Socioeconomic History  . Marital status: Married    Spouse name: Not on file  . Number of children: Not on file  . Years of education: Not on file  . Highest education level: Not on file  Social Needs  . Financial resource strain: Not on file  . Food insecurity - worry: Not on file  . Food insecurity - inability: Not on file  . Transportation needs - medical: Not on file  . Transportation needs - non-medical: Not on file  Occupational History  . Not on file  Tobacco Use  . Smoking status: Never Smoker  . Smokeless tobacco: Never Used  Substance  and Sexual Activity  . Alcohol use: No  . Drug use: No  . Sexual activity: Not on file  Other Topics Concern  . Not on file  Social History Narrative  . Not on file    Current Outpatient Medications on File Prior to Visit  Medication Sig Dispense Refill  . amLODipine (NORVASC) 5 MG tablet Take 5 mg daily by mouth.  11  . hydrALAZINE (APRESOLINE) 50 MG tablet Take 50 mg 3 (three) times daily by mouth.    . hydrochlorothiazide (HYDRODIURIL) 25 MG tablet Take 25 mg daily by mouth.    Marland Kitchen ibuprofen (ADVIL,MOTRIN) 200 MG tablet Take 200 mg by mouth every 6 (six) hours as needed.    . Multiple Vitamin (MULTIVITAMIN WITH MINERALS) TABS tablet Take 1 tablet by mouth daily.    Marland Kitchen TONALIN SAFFLOWER OIL CLA PO Take 800 mg by mouth 2 (two) times daily.     No current facility-administered medications on file prior to visit.     Allergies  Allergen Reactions  . Amlodipine Other (See Comments)    Swelling in ankles (edema)---with higher doses per patient.    Family History  Problem Relation Age of Onset  . CAD Mother   . Prostate cancer Neg Hx   . Adrenal disorder Neg Hx     BP 128/80 (BP Location: Left Arm, Patient Position: Sitting,  Cuff Size: Normal)   Pulse 69   Wt 234 lb 12.8 oz (106.5 kg)   SpO2 96%   BMI 30.98 kg/m    Review of Systems Denies chest pain.     Objective:   Physical Exam VITAL SIGNS:  See vs page GENERAL: no distress Ext: no leg edema.      Assessment & Plan:  elev aldo/PRA ratio: goals are to maximize eplerenone, as well as normal K+ and BP ED, new: we discussed.  He wants to reduce coreg if possible.   Patient Instructions  Please half the carvedilol, and double the eplerenone.   blood tests are requested for you today.  We'll let you know about the results.   Please come back for a follow-up appointment in 2 months.

## 2017-01-22 ENCOUNTER — Encounter: Payer: Self-pay | Admitting: Endocrinology

## 2017-02-08 ENCOUNTER — Other Ambulatory Visit: Payer: BLUE CROSS/BLUE SHIELD

## 2017-02-08 DIAGNOSIS — C61 Malignant neoplasm of prostate: Secondary | ICD-10-CM | POA: Diagnosis not present

## 2017-02-09 LAB — PSA

## 2017-02-10 ENCOUNTER — Other Ambulatory Visit: Payer: BLUE CROSS/BLUE SHIELD

## 2017-02-11 ENCOUNTER — Ambulatory Visit (INDEPENDENT_AMBULATORY_CARE_PROVIDER_SITE_OTHER): Payer: BLUE CROSS/BLUE SHIELD | Admitting: Urology

## 2017-02-11 ENCOUNTER — Encounter: Payer: Self-pay | Admitting: Urology

## 2017-02-11 VITALS — BP 136/83 | HR 66 | Ht 73.0 in | Wt 241.7 lb

## 2017-02-11 DIAGNOSIS — C61 Malignant neoplasm of prostate: Secondary | ICD-10-CM

## 2017-02-11 MED ORDER — SILDENAFIL CITRATE 20 MG PO TABS: ORAL_TABLET | ORAL | 6 refills | Status: DC

## 2017-02-11 NOTE — Progress Notes (Signed)
02/11/2017 8:27 AM   Garrett Watkins 1960/04/11 [id number]  Referring provider: Kirk Ruths, MD [place] [facility] [place], [place] [address]  Chief Complaint  Patient presents with  . Prostate Cancer    HPI: 1. Moderate Risk Prostate cancer  - s/p robotic prostatectomy 04/2016 for [id number] Gleason 4+3=7 adenocarcinoma with NEGATIVE margins. Pre-op PSA 4.1. Post-op Course: 07/2016 - PSA <0.1 01/2017 - PSA <0.1  2. SUI (stress urinary incontinence), male - s/p prostatectomy 04/2016. At 9 mos down to 0-1 pads  3. Erectile dysfunction after radical prostatectomy  - s/p prostatectomy 04/2016. At 3 and 9 mos getting some fullness with stimulation but not adequate to penerate, no nitrates.  No significant cardiac history except for hypertension.  No history of MI.  Able to walk up a flight of stairs without difficulty.  Today "Garrett Watkins" is seen in f/u above. PSA Undetectable.     PMH: Past Medical History:  Diagnosis Date  . BPH (benign prostatic hyperplasia)   . Colon adenoma   . Dyslipidemia   . GERD (gastroesophageal reflux disease)    history of  . Hypertension   . OSA (obstructive sleep apnea)   . Prostate cancer [facility])     Surgical History: Past Surgical History:  Procedure Laterality Date  . CARDIAC CATHETERIZATION Right 01/24/2016   Procedure: Left Heart Cath and Coronary Angiography;  Surgeon: Corey Skains, MD;  Location: [place] [facility];  Service: Cardiovascular;  Laterality: Right;  . CYST EXCISION    . LYMPHADENECTOMY Bilateral 04/21/2016   Procedure: PELVIC LYMPHADENECTOMY;  Surgeon: Alexis Frock, MD;  Location: [facility];  Service: Urology;  Laterality: Bilateral;  . NO PAST SURGERIES    . ROBOT ASSISTED LAPAROSCOPIC RADICAL PROSTATECTOMY N/A 04/21/2016   Procedure: ROBOTIC ASSISTED LAPAROSCOPIC RADICAL PROSTATECTOMY;  Surgeon: Alexis Frock, MD;  Location: [facility];  Service: Urology;  Laterality: N/A;  . VASECTOMY       Home Medications:  Allergies as of 02/11/2017      Reactions   Amlodipine Other (See Comments)   Swelling in ankles (edema)---with higher doses per patient.      Medication List        Accurate as of 02/11/17  8:27 AM. Always use your most recent med list.          amLODipine 5 MG tablet Commonly known as:  NORVASC Take 5 mg daily by mouth.   carvedilol 6.25 MG tablet Commonly known as:  COREG Take 1 tablet (6.25 mg total) by mouth 2 (two) times daily with a meal.   eplerenone 50 MG tablet Commonly known as:  INSPRA Take 1 tablet (50 mg total) by mouth 2 (two) times daily.   hydrALAZINE 25 MG tablet Commonly known as:  APRESOLINE TK 1 T PO TID   hydrochlorothiazide 25 MG tablet Commonly known as:  HYDRODIURIL TAKE 1 TABLET (25 MG TOTAL) BY MOUTH ONCE DAILY.   ibuprofen 200 MG tablet Commonly known as:  ADVIL,MOTRIN Take 200 mg by mouth every 6 (six) hours as needed.   multivitamin with minerals Tabs tablet Take 1 tablet by mouth daily.   telmisartan 40 MG tablet Commonly known as:  MICARDIS Take 1 tablet (40 mg total) by mouth daily.   TONALIN SAFFLOWER OIL CLA PO Take 800 mg by mouth 2 (two) times daily.       Allergies:  Allergies  Allergen Reactions  . Amlodipine Other (See Comments)    Swelling in ankles (edema)---with higher  doses per patient.    Family History: Family History  Problem Relation Age of Onset  . CAD Mother   . Prostate cancer Neg Hx   . Adrenal disorder Neg Hx     Social History:  reports that  has never smoked. he has never used smokeless tobacco. He reports that he does not drink alcohol or use drugs.  ROS: UROLOGY Frequent Urination?: No Hard to postpone urination?: No Burning/pain with urination?: No Get up at night to urinate?: No Leakage of urine?: Yes Urine stream starts and stops?: No Trouble starting stream?: No Do you have to strain to urinate?: No Blood in urine?: No Urinary tract infection?:  No Sexually transmitted disease?: No Injury to kidneys or bladder?: No Painful intercourse?: No Weak stream?: No Erection problems?: Yes Penile pain?: No  Gastrointestinal Nausea?: No Vomiting?: No Indigestion/heartburn?: No Diarrhea?: No Constipation?: No  Constitutional Fever: No Night sweats?: No Weight loss?: No Fatigue?: No  Skin Skin rash/lesions?: No Itching?: No  Eyes Blurred vision?: No Double vision?: No  Ears/Nose/Throat Sore throat?: No Sinus problems?: No  Hematologic/Lymphatic Swollen glands?: No Easy bruising?: No  Cardiovascular Leg swelling?: No Chest pain?: No  Respiratory Cough?: No Shortness of breath?: No  Endocrine Excessive thirst?: No  Musculoskeletal Back pain?: No Joint pain?: No  Neurological Headaches?: No Dizziness?: No  Psychologic Depression?: No Anxiety?: No  Physical Exam: BP 136/83 (BP Location: Left Arm, Patient Position: Sitting, Cuff Size: Large)   Pulse 66   Ht 6\' 1"  (1.854 m)   Wt 241 lb 11.2 oz (109.6 kg)   BMI 31.89 kg/m   Constitutional:  Alert and oriented, No acute distress. HEENT: [place] AT, moist mucus membranes.  Trachea midline, no masses. Cardiovascular: No clubbing, cyanosis, or edema. Respiratory: Normal respiratory effort, no increased work of breathing. GI: Abdomen is soft, nontender, nondistended, no abdominal masses GU: No CVA tenderness.  Skin: No rashes, bruises or suspicious lesions. Lymph: No cervical or inguinal adenopathy. Neurologic: Grossly intact, no focal deficits, moving all 4 extremities. Psychiatric: Normal mood and affect.  Laboratory Data: Lab Results  Component Value Date   WBC 4.1 04/10/2016   HGB 12.5 (L) 04/22/2016   HCT 37.5 (L) 04/22/2016   MCV 89.4 04/10/2016   PLT 179 04/10/2016    Lab Results  Component Value Date   CREATININE 1.04 01/19/2017    No results found for: PSA  No results found for: TESTOSTERONE  No results found for:  HGBA1C  Urinalysis    Component Value Date/Time   COLORURINE YELLOW (A) 04/10/2016 0848   APPEARANCEUR Clear 06/02/2016 1355   LABSPEC 1.029 04/10/2016 0848   PHURINE 6.0 04/10/2016 0848   GLUCOSEU Negative 06/02/2016 1355   HGBUR NEGATIVE 04/10/2016 0848   BILIRUBINUR Negative 06/02/2016 1355   KETONESUR NEGATIVE 04/10/2016 0848   PROTEINUR Negative 06/02/2016 1355   PROTEINUR NEGATIVE 04/10/2016 0848   NITRITE Negative 06/02/2016 1355   NITRITE NEGATIVE 04/10/2016 0848   LEUKOCYTESUR Negative 06/02/2016 1355    Assessment & Plan:    1. Prostate cancer ([place]) - excellent prognosis with node negative, margin negative disease and PSA now undetecbatle. Rec Q44mo surveillance until 2020, then yearly  2. SUI (stress urinary incontinence), male - doing well, reinforced importance of Kegel's and time course for improvement.   3. Erectile dysfunction after radical prostatectomy -discussed with patient treatment options.  He is elected to pursue generic sildenafil 1-5 tablets p.o. daily.  He was warned of the risk of priapism and need for  emergent intervention.  RTC 70mos with PSA prior.    Return in about 6 months (around 08/11/2017) for PSA prior.  Nickie Retort, MD  [facility] [address], [place], [place] [address] [phone]

## 2017-02-16 DIAGNOSIS — M25551 Pain in right hip: Secondary | ICD-10-CM | POA: Diagnosis not present

## 2017-02-16 DIAGNOSIS — R1031 Right lower quadrant pain: Secondary | ICD-10-CM | POA: Diagnosis not present

## 2017-02-19 ENCOUNTER — Telehealth: Payer: Self-pay | Admitting: Urology

## 2017-02-19 NOTE — Telephone Encounter (Signed)
Pt spoke with someone here in our office (I didn't see any documentation) and was told to take 1-5 Sildenafil tabs per day.  He took 5 and there is no change.  He would like to try something different.  Please give pt a call at [phone]

## 2017-02-25 ENCOUNTER — Other Ambulatory Visit: Payer: Self-pay | Admitting: Family Medicine

## 2017-02-25 MED ORDER — TADALAFIL 20 MG PO TABS: 20.0000 mg | ORAL_TABLET | Freq: Every day | ORAL | 0 refills | Status: DC | PRN

## 2017-02-26 MED ORDER — TADALAFIL 20 MG PO TABS: 20.0000 mg | ORAL_TABLET | Freq: Every day | ORAL | 3 refills | Status: DC | PRN

## 2017-02-26 NOTE — Telephone Encounter (Signed)
Spoke with pt in reference to cialis and cost. Pt stated that cialis was going to cost over $1000 therefore he did not get it. Made pt aware can make an appt for trimix discussion. Pt was hesitant about trying trimix. Encouraged pt to at least make an appt and discuss with Dr. Pilar Jarvis. Pt agreed and f/u appt was made.

## 2017-03-01 ENCOUNTER — Telehealth: Payer: Self-pay | Admitting: Urology

## 2017-03-01 NOTE — Telephone Encounter (Signed)
Pt calling to follow up on cialis approval for the pharmacy whom states they have faxed our office, needing more info for insurance to cover the Rx.  Pt seems to be confused, pt asking for someone to call him.  Please advise. Thanks. Pt # [phone]

## 2017-03-03 NOTE — Telephone Encounter (Signed)
Pt called back again today.  I told him you had been in touch with insurance company.  He would like a phone call back today so he knows for sure something is in the works.  Please give pt a call and let him know status of what's going on.

## 2017-03-03 NOTE — Telephone Encounter (Signed)
LMOM- PA completed and waiting for insurance company.

## 2017-03-11 ENCOUNTER — Ambulatory Visit: Payer: BLUE CROSS/BLUE SHIELD | Admitting: Urology

## 2017-03-11 VITALS — BP 149/76 | HR 80 | Ht 73.0 in | Wt 237.9 lb

## 2017-03-11 DIAGNOSIS — N529 Male erectile dysfunction, unspecified: Secondary | ICD-10-CM

## 2017-03-11 DIAGNOSIS — C61 Malignant neoplasm of prostate: Secondary | ICD-10-CM

## 2017-03-11 NOTE — Progress Notes (Signed)
03/11/2017 3:19 PM   Garrett Watkins November 18, 1960 [id number]  Referring provider: Kirk Ruths, MD [facility] [place], [place] [address]  No chief complaint on file.   HPI: The patient is a 57 year old gentleman presents today with concerns about erectile dysfunction.  1.Moderate RiskProstate cancer- s/p robotic prostatectomy 04/2016 for pT2N0Mx Gleason 4+3=7 adenocarcinoma with NEGATIVE margins. Pre-op PSA 4.1. Post-op Course: 07/2016 - PSA <0.1 01/2017 - PSA <0.1  2. SUI (stress urinary incontinence), male- s/p prostatectomy 04/2016.At 9 mos down to 0-1 pads  3. Erectile dysfunction after radical prostatectomy- s/p prostatectomy 04/2016.At 3 and 9 mos getting some fullness with stimulation but not adequate to penerate, no nitrates.  No significant cardiac history except for hypertension.  No history of MI.  Able to walk up a flight of stairs without difficulty.  He was  started on sildenafil which at maximum doses had no effect.  He also tried Cialis but this was cost prohibitive.      PMH: Past Medical History:  Diagnosis Date  . BPH (benign prostatic hyperplasia)   . Colon adenoma   . Dyslipidemia   . GERD (gastroesophageal reflux disease)    history of  . Hypertension   . OSA (obstructive sleep apnea)   . Prostate cancer [facility])     Surgical History: Past Surgical History:  Procedure Laterality Date  . CARDIAC CATHETERIZATION Right 01/24/2016   Procedure: Left Heart Cath and Coronary Angiography;  Surgeon: Corey Skains, MD;  Location: [place] [facility];  Service: Cardiovascular;  Laterality: Right;  . CYST EXCISION    . LYMPHADENECTOMY Bilateral 04/21/2016   Procedure: PELVIC LYMPHADENECTOMY;  Surgeon: Alexis Frock, MD;  Location: [facility];  Service: Urology;  Laterality: Bilateral;  . NO PAST SURGERIES    . ROBOT ASSISTED LAPAROSCOPIC RADICAL PROSTATECTOMY N/A 04/21/2016   Procedure: ROBOTIC ASSISTED LAPAROSCOPIC  RADICAL PROSTATECTOMY;  Surgeon: Alexis Frock, MD;  Location: [facility] OR[facility];  Service: Urology;  Laterality: N/A;  . VASECTOMY      Home Medications:  Allergies as of 03/11/2017      Reactions   Amlodipine Other (See Comments)   Swelling in ankles (edema)---with higher doses per patient.      Medication List        Accurate as of 03/11/17  3:19 PM. Always use your most recent med list.          amLODipine 5 MG tablet Commonly known as:  NORVASC Take 5 mg daily by mouth.   carvedilol 6.25 MG tablet Commonly known as:  COREG Take 1 tablet (6.25 mg total) by mouth 2 (two) times daily with a meal.   eplerenone 50 MG tablet Commonly known as:  INSPRA Take 1 tablet (50 mg total) by mouth 2 (two) times daily.   hydrALAZINE 25 MG tablet Commonly known as:  APRESOLINE TK 1 T PO TID   hydrochlorothiazide 25 MG tablet Commonly known as:  HYDRODIURIL TAKE 1 TABLET (25 MG TOTAL) BY MOUTH ONCE DAILY.   ibuprofen 200 MG tablet Commonly known as:  ADVIL,MOTRIN Take 200 mg by mouth every 6 (six) hours as needed.   multivitamin with minerals Tabs tablet Take 1 tablet by mouth daily.   sildenafil 20 MG tablet Commonly known as:  REVATIO Take 1 to 5 tabs PO daily prn   tadalafil 20 MG tablet Commonly known as:  CIALIS Take 1 tablet (20 mg total) by mouth daily as needed for erectile dysfunction.   tadalafil 20  MG tablet Commonly known as:  CIALIS Take 1 tablet (20 mg total) by mouth daily as needed for erectile dysfunction.   telmisartan 40 MG tablet Commonly known as:  MICARDIS Take 1 tablet (40 mg total) by mouth daily.   TONALIN SAFFLOWER OIL CLA PO Take 800 mg by mouth 2 (two) times daily.       Allergies:  Allergies  Allergen Reactions  . Amlodipine Other (See Comments)    Swelling in ankles (edema)---with higher doses per patient.    Family History: Family History  Problem Relation Age of Onset  . CAD Mother   . Prostate cancer Neg Hx   . Adrenal disorder  Neg Hx     Social History:  reports that  has never smoked. he has never used smokeless tobacco. He reports that he does not drink alcohol or use drugs.  ROS:                                        Physical Exam: There were no vitals taken for this visit.  Constitutional:  Alert and oriented, No acute distress. HEENT: [place] AT, moist mucus membranes.  Trachea midline, no masses. Cardiovascular: No clubbing, cyanosis, or edema. Respiratory: Normal respiratory effort, no increased work of breathing. GI: Abdomen is soft, nontender, nondistended, no abdominal masses GU: No CVA tenderness.  Skin: No rashes, bruises or suspicious lesions. Lymph: No cervical or inguinal adenopathy. Neurologic: Grossly intact, no focal deficits, moving all 4 extremities. Psychiatric: Normal mood and affect.  Laboratory Data: Lab Results  Component Value Date   WBC 4.1 04/10/2016   HGB 12.5 (L) 04/22/2016   HCT 37.5 (L) 04/22/2016   MCV 89.4 04/10/2016   PLT 179 04/10/2016    Lab Results  Component Value Date   CREATININE 1.04 01/19/2017    No results found for: PSA  No results found for: TESTOSTERONE  No results found for: HGBA1C  Urinalysis    Component Value Date/Time   COLORURINE YELLOW (A) 04/10/2016 0848   APPEARANCEUR Clear 06/02/2016 1355   LABSPEC 1.029 04/10/2016 0848   PHURINE 6.0 04/10/2016 0848   GLUCOSEU Negative 06/02/2016 1355   HGBUR NEGATIVE 04/10/2016 0848   BILIRUBINUR Negative 06/02/2016 1355   KETONESUR NEGATIVE 04/10/2016 0848   PROTEINUR Negative 06/02/2016 1355   PROTEINUR NEGATIVE 04/10/2016 0848   NITRITE Negative 06/02/2016 1355   NITRITE NEGATIVE 04/10/2016 0848   LEUKOCYTESUR Negative 06/02/2016 1355    Assessment & Plan:    1. Prostate cancer [facility])- excellent prognosis with node negative, margin negative disease and PSA now undetecbatle. Rec Q25mo surveillance until 2020, then yearly  2. SUI (stress urinary incontinence),  male- doing well, reinforced importance of Kegel's and time course for improvement.  3. Erectile dysfunction after radical prostatectomy-discussed with patient treatment options.    I discussed treatment the patient including a vacuum erection device,MUSE, and penile injection therapy.  We did discuss that Edex is return to market at low cost and does not require being in the fridgerator.  We also discussed trimix.  We discussed the risk of priapism and need for emergent intervention.  The patient will think about which of the 2 medications he would like to try.  He will call the office at that time.  We will order either Edex or trimix.  He will follow-up for training after he decides which medication to pursue.  He  Nickie Retort, MD  [facility] [address], [place], [place] [address] [phone]

## 2017-03-16 ENCOUNTER — Telehealth: Payer: Self-pay | Admitting: Urology

## 2017-03-16 NOTE — Telephone Encounter (Signed)
Pt would like to pursue with Edex injection.  Per Budzyn's note, it needs to be ordered and pt called to schedule an appt for teaching.  Pt was concerned about not getting a return call.  I assured him when meds were ordered and ready, we would call him to schedule appt.

## 2017-03-17 NOTE — Telephone Encounter (Signed)
Edex ordered.

## 2017-03-18 ENCOUNTER — Ambulatory Visit: Payer: BLUE CROSS/BLUE SHIELD | Admitting: Endocrinology

## 2017-03-18 ENCOUNTER — Encounter: Payer: Self-pay | Admitting: Endocrinology

## 2017-03-18 VITALS — BP 150/92 | HR 74 | Wt 237.6 lb

## 2017-03-18 DIAGNOSIS — R7989 Other specified abnormal findings of blood chemistry: Secondary | ICD-10-CM

## 2017-03-18 DIAGNOSIS — R799 Abnormal finding of blood chemistry, unspecified: Secondary | ICD-10-CM | POA: Diagnosis not present

## 2017-03-18 LAB — BASIC METABOLIC PANEL
BUN: 26 mg/dL — AB (ref 6–23)
CO2: 28 meq/L (ref 19–32)
Calcium: 10.1 mg/dL (ref 8.4–10.5)
Chloride: 101 mEq/L (ref 96–112)
Creatinine, Ser: 1.23 mg/dL (ref 0.40–1.50)
GFR: 78.12 mL/min (ref >60.00)
GLUCOSE: 74 mg/dL (ref 70–99)
Potassium: 3.8 mEq/L (ref 3.5–5.1)
SODIUM: 137 meq/L (ref 135–145)

## 2017-03-18 MED ORDER — TELMISARTAN 40 MG PO TABS: 40.0000 mg | ORAL_TABLET | Freq: Every day | ORAL | 3 refills | Status: DC

## 2017-03-18 MED ORDER — HYDROCHLOROTHIAZIDE 25 MG PO TABS: 12.5000 mg | ORAL_TABLET | Freq: Every day | ORAL | 3 refills | Status: DC

## 2017-03-18 NOTE — Progress Notes (Signed)
Subjective:    Patient ID: Garrett Watkins, male    DOB: April 27, 1960, 57 y.o.   MRN: [id number]  HPI Pt returns for f/u of elev aldo/PRA ratio (HTN was dx'ed in 2008; hypokalemia was first noted in 2018; in Dec of 2017, he was in the hospital for severe HTN; he was rxed eplerenone in Nov of 2018; adrenals were normal on 12/18 CT).  He has ED sxs since prostate surgery.  He does not take the micardis.  Past Medical History:  Diagnosis Date  . BPH (benign prostatic hyperplasia)   . Colon adenoma   . Dyslipidemia   . GERD (gastroesophageal reflux disease)    history of  . Hypertension   . OSA (obstructive sleep apnea)   . Prostate cancer [facility])     Past Surgical History:  Procedure Laterality Date  . CARDIAC CATHETERIZATION Right 01/24/2016   Procedure: Left Heart Cath and Coronary Angiography;  Surgeon: Corey Skains, MD;  Location: [place] [facility];  Service: Cardiovascular;  Laterality: Right;  . CYST EXCISION    . LYMPHADENECTOMY Bilateral 04/21/2016   Procedure: PELVIC LYMPHADENECTOMY;  Surgeon: Alexis Frock, MD;  Location: [facility];  Service: Urology;  Laterality: Bilateral;  . NO PAST SURGERIES    . ROBOT ASSISTED LAPAROSCOPIC RADICAL PROSTATECTOMY N/A 04/21/2016   Procedure: ROBOTIC ASSISTED LAPAROSCOPIC RADICAL PROSTATECTOMY;  Surgeon: Alexis Frock, MD;  Location: [facility];  Service: Urology;  Laterality: N/A;  . VASECTOMY      Social History   Socioeconomic History  . Marital status: Married    Spouse name: Not on file  . Number of children: Not on file  . Years of education: Not on file  . Highest education level: Not on file  Social Needs  . Financial resource strain: Not on file  . Food insecurity - worry: Not on file  . Food insecurity - inability: Not on file  . Transportation needs - medical: Not on file  . Transportation needs - non-medical: Not on file  Occupational History  . Not on file  Tobacco Use  . Smoking status: Never Smoker  . Smokeless  tobacco: Never Used  Substance and Sexual Activity  . Alcohol use: No  . Drug use: No  . Sexual activity: Not on file  Other Topics Concern  . Not on file  Social History Narrative  . Not on file    Current Outpatient Medications on File Prior to Visit  Medication Sig Dispense Refill  . amLODipine (NORVASC) 5 MG tablet Take 5 mg daily by mouth.  11  . carvedilol (COREG) 6.25 MG tablet Take 1 tablet (6.25 mg total) by mouth 2 (two) times daily with a meal. 60 tablet 11  . eplerenone (INSPRA) 50 MG tablet Take 1 tablet (50 mg total) by mouth 2 (two) times daily. 60 tablet 11  . hydrALAZINE (APRESOLINE) 25 MG tablet TK 1 T PO TID  6  . ibuprofen (ADVIL,MOTRIN) 200 MG tablet Take 200 mg by mouth every 6 (six) hours as needed.    . Multiple Vitamin (MULTIVITAMIN WITH MINERALS) TABS tablet Take 1 tablet by mouth daily.    Marland Kitchen TONALIN SAFFLOWER OIL CLA PO Take 800 mg by mouth 2 (two) times daily.    . sildenafil (REVATIO) 20 MG tablet Take 1 to 5 tabs PO daily prn (Patient not taking: Reported on 03/18/2017) 30 tablet 6  . tadalafil (CIALIS) 20 MG tablet Take 1 tablet (20 mg total) by mouth daily as needed for erectile  dysfunction. (Patient not taking: Reported on 03/18/2017) 18 tablet 0   No current facility-administered medications on file prior to visit.     Allergies  Allergen Reactions  . Amlodipine Other (See Comments)    Swelling in ankles (edema)---with higher doses per patient.    Family History  Problem Relation Age of Onset  . CAD Mother   . Prostate cancer Neg Hx   . Adrenal disorder Neg Hx     BP (!) 150/92 (BP Location: Left Arm, Patient Position: Sitting, Cuff Size: Normal)   Pulse 74   Wt 237 lb 9.6 oz (107.8 kg)   SpO2 97%   BMI 31.35 kg/m    Review of Systems He denies sob    Objective:   Physical Exam VITAL SIGNS:  See vs page GENERAL: no distress Ext: no leg edema      Assessment & Plan:  elev aldo/PRA ratio: he is in max rx HTN: he needs increased  rx  Patient Instructions  I have sent a prescription to your pharmacy, to resume the micardis.   Please continue the same other medications blood tests are requested for you today.  We'll let you know about the results.   Please come back for a follow-up appointment in 6 months.

## 2017-03-18 NOTE — Patient Instructions (Addendum)
I have sent a prescription to your pharmacy, to resume the micardis.   Please continue the same other medications blood tests are requested for you today.  We'll let you know about the results.   Please come back for a follow-up appointment in 6 months.

## 2017-03-19 ENCOUNTER — Encounter: Payer: Self-pay | Admitting: Endocrinology

## 2017-03-22 ENCOUNTER — Ambulatory Visit: Payer: BLUE CROSS/BLUE SHIELD | Admitting: Urology

## 2017-03-22 ENCOUNTER — Other Ambulatory Visit: Payer: Self-pay | Admitting: Endocrinology

## 2017-03-22 ENCOUNTER — Encounter: Payer: Self-pay | Admitting: Urology

## 2017-03-22 VITALS — BP 108/66 | HR 75 | Resp 16 | Ht 73.0 in | Wt 233.0 lb

## 2017-03-22 DIAGNOSIS — N529 Male erectile dysfunction, unspecified: Secondary | ICD-10-CM | POA: Diagnosis not present

## 2017-03-22 DIAGNOSIS — I10 Essential (primary) hypertension: Secondary | ICD-10-CM

## 2017-03-22 NOTE — Progress Notes (Signed)
03/22/2017 10:45 AM   Garrett Watkins 19-Feb-1960 [id number]  Referring provider: Kirk Ruths, MD [facility] [place], [place] [address]  Chief Complaint  Patient presents with  . Follow-up    HPI: 57 yo AAM with moderate risk prostate cancer, SUI and ED who presents today for Edex teaching.    1.Moderate RiskProstate cancer- s/p robotic prostatectomy 04/2016 for pT2N0Mx Gleason 4+3=7 adenocarcinoma with NEGATIVE margins. Pre-op PSA 4.1. Post-op Course: 07/2016 - PSA <0.1 01/2017 - PSA <0.1  2. SUI (stress urinary incontinence), male- s/p prostatectomy 04/2016.At 9 mos down to 0-1 pads  3. Erectile dysfunction after radical prostatectomy- s/p prostatectomy 04/2016.At 3 and 9 mos getting some fullness with stimulation but not adequate to penetrate, no nitrates.  No significant cardiac history except for hypertension.  No history of MI.  Able to walk up a flight of stairs without difficulty.  He was  started on sildenafil which at maximum doses had no effect.  He also tried Cialis but this was cost prohibitive.  Today, he presents for Edex injection.      PMH: Past Medical History:  Diagnosis Date  . BPH (benign prostatic hyperplasia)   . Colon adenoma   . Dyslipidemia   . GERD (gastroesophageal reflux disease)    history of  . Hypertension   . OSA (obstructive sleep apnea)   . Prostate cancer [facility])     Surgical History: Past Surgical History:  Procedure Laterality Date  . CARDIAC CATHETERIZATION Right 01/24/2016   Procedure: Left Heart Cath and Coronary Angiography;  Surgeon: Corey Skains, MD;  Location: [place] [facility];  Service: Cardiovascular;  Laterality: Right;  . CYST EXCISION    . LYMPHADENECTOMY Bilateral 04/21/2016   Procedure: PELVIC LYMPHADENECTOMY;  Surgeon: Alexis Frock, MD;  Location: [facility];  Service: Urology;  Laterality: Bilateral;  . NO PAST SURGERIES    . ROBOT ASSISTED LAPAROSCOPIC RADICAL  PROSTATECTOMY N/A 04/21/2016   Procedure: ROBOTIC ASSISTED LAPAROSCOPIC RADICAL PROSTATECTOMY;  Surgeon: Alexis Frock, MD;  Location: [facility];  Service: Urology;  Laterality: N/A;  . VASECTOMY      Home Medications:  Allergies as of 03/22/2017      Reactions   Amlodipine Other (See Comments)   Swelling in ankles (edema)---with higher doses per patient.      Medication List        Accurate as of 03/22/17 10:45 AM. Always use your most recent med list.          amLODipine 5 MG tablet Commonly known as:  NORVASC Take 5 mg daily by mouth.   carvedilol 6.25 MG tablet Commonly known as:  COREG Take 1 tablet (6.25 mg total) by mouth 2 (two) times daily with a meal.   eplerenone 50 MG tablet Commonly known as:  INSPRA Take 1 tablet (50 mg total) by mouth 2 (two) times daily.   hydrALAZINE 25 MG tablet Commonly known as:  APRESOLINE TK 1 T PO TID   hydrochlorothiazide 25 MG tablet Commonly known as:  HYDRODIURIL Take 0.5 tablets (12.5 mg total) by mouth daily.   ibuprofen 200 MG tablet Commonly known as:  ADVIL,MOTRIN Take 200 mg by mouth every 6 (six) hours as needed.   multivitamin with minerals Tabs tablet Take 1 tablet by mouth daily.   sildenafil 20 MG tablet Commonly known as:  REVATIO Take 1 to 5 tabs PO daily prn   tadalafil 20 MG tablet Commonly known as:  CIALIS Take 1  tablet (20 mg total) by mouth daily as needed for erectile dysfunction.   telmisartan 40 MG tablet Commonly known as:  MICARDIS Take 1 tablet (40 mg total) by mouth daily.   TONALIN SAFFLOWER OIL CLA PO Take 800 mg by mouth 2 (two) times daily.       Allergies:  Allergies  Allergen Reactions  . Amlodipine Other (See Comments)    Swelling in ankles (edema)---with higher doses per patient.    Family History: Family History  Problem Relation Age of Onset  . CAD Mother   . Prostate cancer Neg Hx   . Adrenal disorder Neg Hx     Social History:  reports that  has never smoked.  he has never used smokeless tobacco. He reports that he does not drink alcohol or use drugs.  ROS: UROLOGY Frequent Urination?: No Hard to postpone urination?: No Burning/pain with urination?: No Get up at night to urinate?: No Leakage of urine?: No Urine stream starts and stops?: No Trouble starting stream?: No Do you have to strain to urinate?: No Blood in urine?: No Urinary tract infection?: No Sexually transmitted disease?: No Injury to kidneys or bladder?: No Painful intercourse?: No Weak stream?: No Erection problems?: Yes Penile pain?: No  Gastrointestinal Nausea?: No Vomiting?: No Indigestion/heartburn?: No Diarrhea?: No Constipation?: No  Constitutional Fever: No Night sweats?: No Weight loss?: No Fatigue?: No  Skin Skin rash/lesions?: No Itching?: No  Eyes Blurred vision?: No Double vision?: No  Ears/Nose/Throat Sore throat?: No Sinus problems?: No  Hematologic/Lymphatic Swollen glands?: No Easy bruising?: No  Cardiovascular Leg swelling?: No Chest pain?: No  Respiratory Cough?: No Shortness of breath?: No  Endocrine Excessive thirst?: No  Musculoskeletal Back pain?: No Joint pain?: No  Neurological Headaches?: No Dizziness?: No  Psychologic Depression?: No Anxiety?: No  Physical Exam: BP 108/66   Pulse 75   Resp 16   Ht 6\' 1"  (1.854 m)   Wt 233 lb (105.7 kg)   BMI 30.74 kg/m   Constitutional: Well nourished. Alert and oriented, No acute distress. HEENT: [place] AT, moist mucus membranes. Trachea midline, no masses. Cardiovascular: No clubbing, cyanosis, or edema. Respiratory: Normal respiratory effort, no increased work of breathing. GI: Abdomen is soft, non tender, non distended, no abdominal masses. Liver and spleen not palpable.  No hernias appreciated.  Stool sample for occult testing is not indicated.   GU: No CVA tenderness.  No bladder fullness or masses.  Patient with circumcised phallus.  Urethral meatus is patent.   No penile discharge. No penile lesions or rashes.  Skin: No rashes, bruises or suspicious lesions. Lymph: No cervical or inguinal adenopathy. Neurologic: Grossly intact, no focal deficits, moving all 4 extremities. Psychiatric: Normal mood and affect.  Laboratory Data: Lab Results  Component Value Date   WBC 4.1 04/10/2016   HGB 12.5 (L) 04/22/2016   HCT 37.5 (L) 04/22/2016   MCV 89.4 04/10/2016   PLT 179 04/10/2016    Lab Results  Component Value Date   CREATININE 1.23 03/18/2017    No results found for: PSA  No results found for: TESTOSTERONE  No results found for: HGBA1C  Urinalysis    Component Value Date/Time   COLORURINE YELLOW (A) 04/10/2016 0848   APPEARANCEUR Clear 06/02/2016 1355   LABSPEC 1.029 04/10/2016 0848   PHURINE 6.0 04/10/2016 0848   GLUCOSEU Negative 06/02/2016 1355   HGBUR NEGATIVE 04/10/2016 0848   BILIRUBINUR Negative 06/02/2016 [place] 04/10/2016 0848   PROTEINUR Negative 06/02/2016 1355  PROTEINUR NEGATIVE 04/10/2016 0848   NITRITE Negative 06/02/2016 1355   NITRITE NEGATIVE 04/10/2016 0848   LEUKOCYTESUR Negative 06/02/2016 1355   I have reviewed the labs.   Procedure Patient's left corpus cavernosum is identified.  An area near the base of the penis is cleansed with rubbing alcohol.  Careful to avoid the dorsal vein, 0.5 mg of alprostadil is injected at a 90 degree angle into the left corpus cavernosum near the base of the penis.  Patient experienced a semi firm erection in 15 minutes.    Assessment & Plan:    1. Prostate cancer [facility])- excellent prognosis with node negative, margin negative disease and PSA now undetectable. Rec Q77mo surveillance until 2020, then yearly  2. SUI (stress urinary incontinence), male- doing well, reinforced importance of Kegel's and time course for improvement.  3. Erectile dysfunction after radical prostatectomy - patient experienced a semi firm erection with 0.5 of the Edex -  he is comfortable with injecting himself and we try a dosage of 0.75 mg with his next injection - Advised patient of the condition of priapism, painful erection lasting for more than four hours, and to contact the office immediately or seek treatment in the ED   Zara Council, [place] [address] [place], [place] [address] [phone]

## 2017-05-06 ENCOUNTER — Encounter: Payer: Self-pay | Admitting: Urology

## 2017-05-07 ENCOUNTER — Telehealth: Payer: Self-pay | Admitting: Urology

## 2017-05-07 NOTE — Telephone Encounter (Signed)
Will you call the speciality pharmacy and change Garrett Watkins EDEX 40 mcg?  I have advised him schedule another titration appointment.

## 2017-05-10 NOTE — Telephone Encounter (Signed)
Spoke to patient, he gets his RX from [facility]. I will get RX sent. He needs a appointment for titration for new EDEX dose.

## 2017-05-10 NOTE — Telephone Encounter (Signed)
Medication has been faxed to pharmacy. Appointment has been scheduled.

## 2017-06-09 ENCOUNTER — Ambulatory Visit: Payer: BLUE CROSS/BLUE SHIELD | Admitting: Urology

## 2017-07-31 IMAGING — DX DG CHEST 1V PORT
1 series · 1 of 1 positions shown · non-contrast
Comparison: None.

CLINICAL DATA: Left-sided chest pressure, left arm pain.

EXAM:
PORTABLE CHEST 1 VIEW

[chest ap]
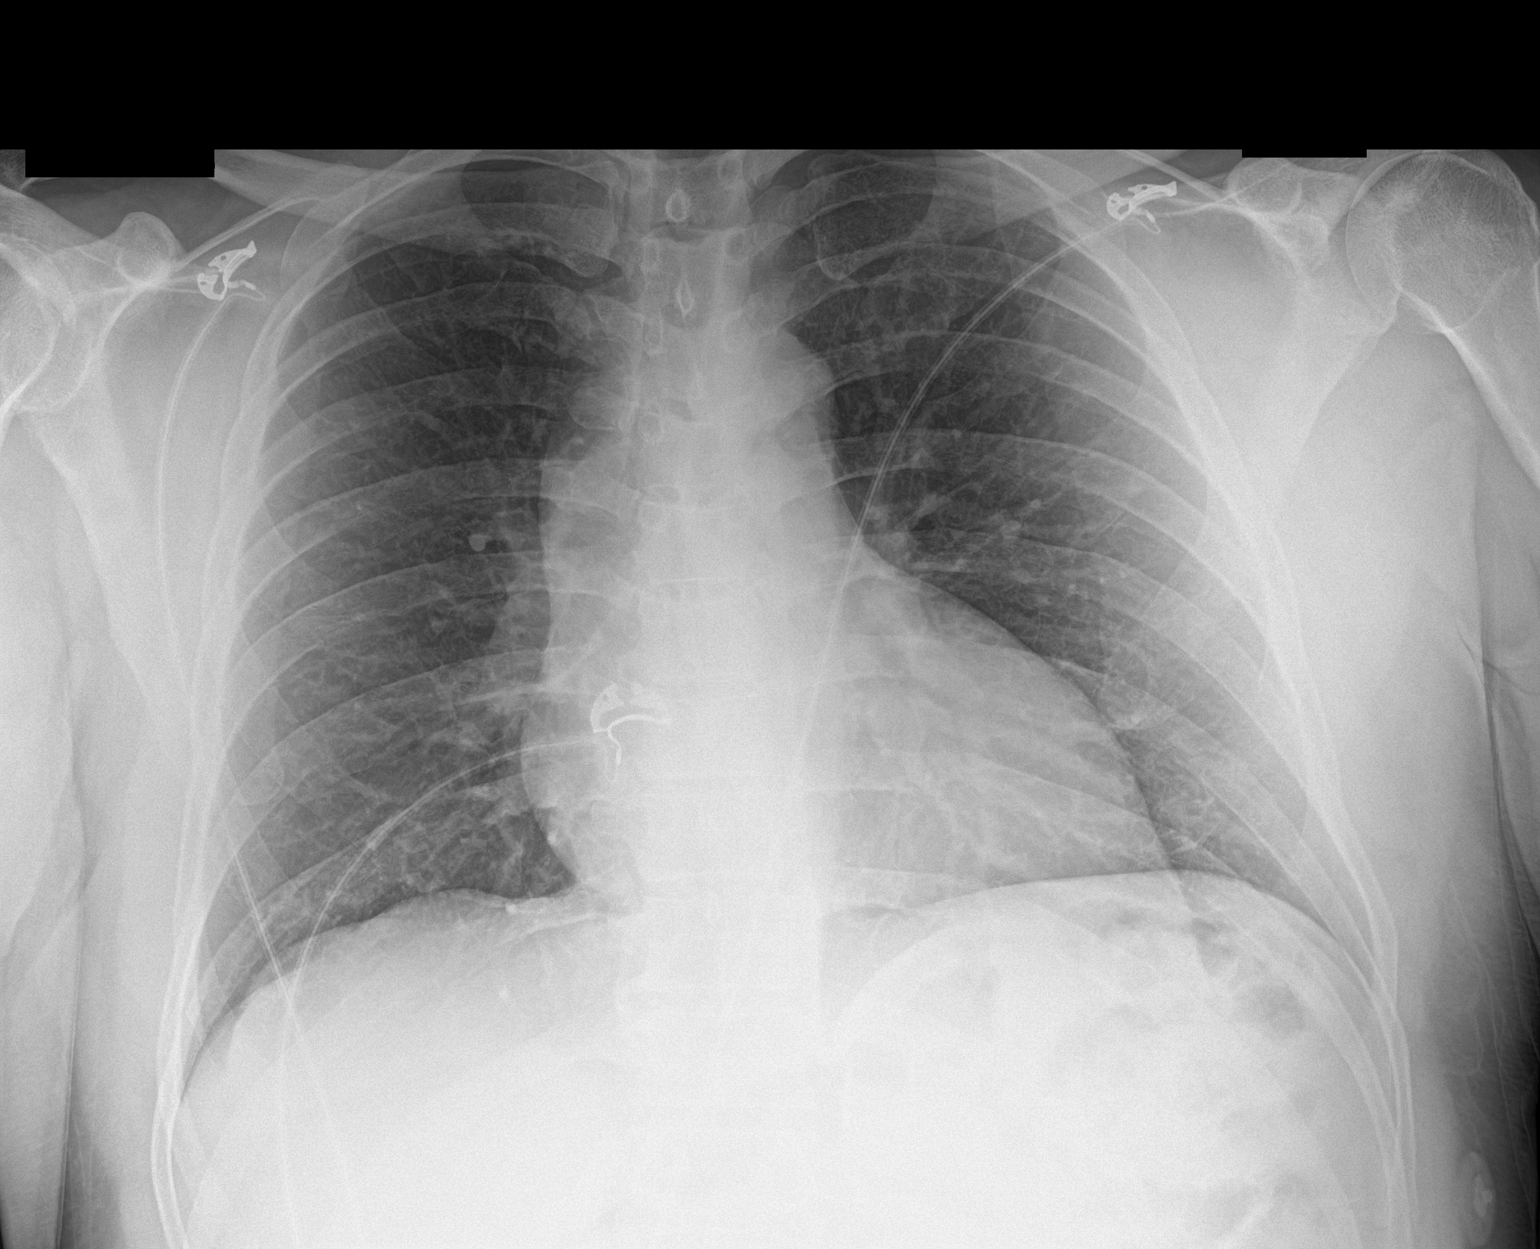

[1 of 1 positions shown; findings below may reference images not displayed]

FINDINGS: Heart and mediastinal contours are within normal limits. No focal
opacities or effusions. No acute bony abnormality.
IMPRESSION: No active disease.

## 2017-08-09 ENCOUNTER — Encounter: Payer: Self-pay | Admitting: Urology

## 2017-08-10 ENCOUNTER — Other Ambulatory Visit: Payer: Self-pay | Admitting: Urology

## 2017-08-10 ENCOUNTER — Other Ambulatory Visit: Payer: BLUE CROSS/BLUE SHIELD

## 2017-08-10 DIAGNOSIS — C61 Malignant neoplasm of prostate: Secondary | ICD-10-CM

## 2017-08-10 NOTE — Progress Notes (Signed)
Error

## 2017-08-10 NOTE — Progress Notes (Signed)
08/12/2017 12:08 PM   Garrett Watkins 04/07/1960 [id number]  Referring provider: Kirk Ruths, MD [facility] [name], [place] [address]  Chief Complaint  Patient presents with  . Erectile Dysfunction  . Follow-up    HPI: 57 yo AAM with moderate risk prostate cancer, SUI and ED who presents today for follow up.    1.Moderate RiskProstate cancer- s/p robotic prostatectomy 04/2016 for pT2N0Mx Gleason 4+3=7 adenocarcinoma with NEGATIVE margins. Pre-op PSA 4.1. Post-op Course: 07/2016 - PSA < 0.1 01/2017 - PSA < 0.1 07/2017 - PSA < 0.1  2. SUI (stress urinary incontinence), male- s/p prostatectomy 04/2016.At 9 mos down to 0-1 pads.  He is having nocturia and incontinence.    3. Erectile dysfunction after radical prostatectomy- s/p prostatectomy 04/2016.At 3 and 9 mos getting some fullness with stimulation but not adequate to penetrate, no nitrates.  No significant cardiac history except for hypertension.  No history of MI. Able to walk up a flight of stairs without difficulty.  He was  started on sildenafil which at maximum doses had no effect.  He also tried Cialis but this was cost prohibitive.  He is not comfortable with the intracavernousal injections.    PMH: Past Medical History:  Diagnosis Date  . BPH (benign prostatic hyperplasia)   . Colon adenoma   . Dyslipidemia   . GERD (gastroesophageal reflux disease)    history of  . Hypertension   . OSA (obstructive sleep apnea)   . Prostate cancer [facility])     Surgical History: Past Surgical History:  Procedure Laterality Date  . CARDIAC CATHETERIZATION Right 01/24/2016   Procedure: Left Heart Cath and Coronary Angiography;  Surgeon: Corey Skains, MD;  Location: [place] [facility];  Service: Cardiovascular;  Laterality: Right;  . CYST EXCISION    . LYMPHADENECTOMY Bilateral 04/21/2016   Procedure: PELVIC LYMPHADENECTOMY;  Surgeon: Alexis Frock, MD;  Location: [facility] ORS;  Service:  Urology;  Laterality: Bilateral;  . NO PAST SURGERIES    . ROBOT ASSISTED LAPAROSCOPIC RADICAL PROSTATECTOMY N/A 04/21/2016   Procedure: ROBOTIC ASSISTED LAPAROSCOPIC RADICAL PROSTATECTOMY;  Surgeon: Alexis Frock, MD;  Location: [facility] ORS;  Service: Urology;  Laterality: N/A;  . VASECTOMY      Home Medications:  Allergies as of 08/12/2017      Reactions   Amlodipine Other (See Comments)   Swelling in ankles (edema)---with higher doses per patient.      Medication List        Accurate as of 08/12/17 12:08 PM. Always use your most recent med list.          Alprostadil (Vasodilator) 500 MCG Pllt Commonly known as:  MUSE Place the pellet into the urethra 30 minutes prior to intercourse.   amLODipine 5 MG tablet Commonly known as:  NORVASC Take 5 mg daily by mouth.   carvedilol 6.25 MG tablet Commonly known as:  COREG Take 1 tablet (6.25 mg total) by mouth 2 (two) times daily with a meal.   eplerenone 50 MG tablet Commonly known as:  INSPRA Take 1 tablet (50 mg total) by mouth 2 (two) times daily.   hydrALAZINE 25 MG tablet Commonly known as:  APRESOLINE TK 1 T PO TID   hydrochlorothiazide 25 MG tablet Commonly known as:  HYDRODIURIL Take 0.5 tablets (12.5 mg total) by mouth daily.   ibuprofen 200 MG tablet Commonly known as:  ADVIL,MOTRIN Take 200 mg by mouth every 6 (six) hours as needed.   multivitamin  with minerals Tabs tablet Take 1 tablet by mouth daily.   sildenafil 20 MG tablet Commonly known as:  REVATIO Take 1 to 5 tabs PO daily prn   tadalafil 20 MG tablet Commonly known as:  ADCIRCA/CIALIS Take 1 tablet (20 mg total) by mouth daily as needed for erectile dysfunction.   telmisartan 40 MG tablet Commonly known as:  MICARDIS Take 1 tablet (40 mg total) by mouth daily.   TONALIN SAFFLOWER OIL CLA PO Take 800 mg by mouth 2 (two) times daily.       Allergies:  Allergies  Allergen Reactions  . Amlodipine Other (See Comments)    Swelling in ankles  (edema)---with higher doses per patient.    Family History: Family History  Problem Relation Age of Onset  . CAD Mother   . Prostate cancer Neg Hx   . Adrenal disorder Neg Hx     Social History:  reports that he has never smoked. He has never used smokeless tobacco. He reports that he does not drink alcohol or use drugs.  ROS: UROLOGY Frequent Urination?: No Hard to postpone urination?: No Burning/pain with urination?: No Get up at night to urinate?: Yes Leakage of urine?: Yes Urine stream starts and stops?: No Trouble starting stream?: No Do you have to strain to urinate?: No Blood in urine?: No Urinary tract infection?: No Sexually transmitted disease?: No Injury to kidneys or bladder?: No Painful intercourse?: No Weak stream?: No Erection problems?: Yes Penile pain?: No  Gastrointestinal Nausea?: No Vomiting?: No Indigestion/heartburn?: No Diarrhea?: No Constipation?: No  Constitutional Fever: No Night sweats?: No Weight loss?: No Fatigue?: No  Skin Skin rash/lesions?: No Itching?: No  Eyes Blurred vision?: No Double vision?: No  Ears/Nose/Throat Sore throat?: No Sinus problems?: No  Hematologic/Lymphatic Swollen glands?: No Easy bruising?: No  Cardiovascular Leg swelling?: No Chest pain?: No  Respiratory Cough?: No Shortness of breath?: No  Endocrine Excessive thirst?: No  Musculoskeletal Back pain?: No Joint pain?: No  Neurological Headaches?: No Dizziness?: No  Psychologic Depression?: No Anxiety?: No  Physical Exam: BP (!) 151/76 (BP Location: Left Arm, Patient Position: Sitting, Cuff Size: Large)   Pulse 69   Ht 6\' 1"  (1.854 m)   Wt 233 lb 4.8 oz (105.8 kg)   BMI 30.78 kg/m   Constitutional: Well nourished. Alert and oriented, No acute distress. HEENT: [place] AT, moist mucus membranes. Trachea midline, no masses. Cardiovascular: No clubbing, cyanosis, or edema. Respiratory: Normal respiratory effort, no increased work  of breathing. GI: Abdomen is soft, non tender, non distended, no abdominal masses. Liver and spleen not palpable.  No hernias appreciated.  Stool sample for occult testing is not indicated.   GU: No CVA tenderness.  No bladder fullness or masses.  Patient with circumcised phallus.  Urethral meatus is patent.  No penile discharge. No penile lesions or rashes.  Skin: No rashes, bruises or suspicious lesions. Lymph: No cervical or inguinal adenopathy. Neurologic: Grossly intact, no focal deficits, moving all 4 extremities. Psychiatric: Normal mood and affect.  Laboratory Data: Lab Results  Component Value Date   WBC 4.1 04/10/2016   HGB 12.5 (L) 04/22/2016   HCT 37.5 (L) 04/22/2016   MCV 89.4 04/10/2016   PLT 179 04/10/2016    Lab Results  Component Value Date   CREATININE 1.23 03/18/2017    No results found for: PSA  No results found for: TESTOSTERONE  No results found for: HGBA1C  Urinalysis    Component Value Date/Time   COLORURINE YELLOW (  A) 04/10/2016 0848   APPEARANCEUR Clear 06/02/2016 1355   LABSPEC 1.029 04/10/2016 0848   PHURINE 6.0 04/10/2016 0848   GLUCOSEU Negative 06/02/2016 1355   HGBUR NEGATIVE 04/10/2016 0848   BILIRUBINUR Negative 06/02/2016 [place] 04/10/2016 0848   PROTEINUR Negative 06/02/2016 1355   PROTEINUR NEGATIVE 04/10/2016 0848   NITRITE Negative 06/02/2016 1355   NITRITE NEGATIVE 04/10/2016 0848   LEUKOCYTESUR Negative 06/02/2016 1355   I have reviewed the labs.   Assessment & Plan:    1. Prostate cancer [facility])- excellent prognosis with node negative, margin negative disease and PSA now undetectable. Rec Q81mo surveillance until 2020, then yearly  2. SUI (stress urinary incontinence), male- doing well, reinforced importance of Kegel's and time course for improvement.  3. Erectile dysfunction after radical prostatectomy Patient was not comfortable with the injections - he will try PDE5i again - I have sent in script  for sildenafil and tadalafil to see which medication will be cost effective and/or goal effective Will discuss further PT effectiveness with Dr. Aurea Graff Will research to see if there is an auto injector that would deliver the medication slowly vs "rapid fire" technique  Zara Council, [place] [address] [place], [place] [address] [phone]

## 2017-08-11 ENCOUNTER — Other Ambulatory Visit: Payer: Self-pay | Admitting: Urology

## 2017-08-11 LAB — PSA: Prostate Specific Ag, Serum: <0.1 ng/mL (ref 0.0–4.0)

## 2017-08-11 MED ORDER — ALPROSTADIL (VASODILATOR) 500 MCG UR PLLT: PELLET | URETHRAL | 0 refills | Status: DC

## 2017-08-11 NOTE — Progress Notes (Signed)
MUSE script sent in.

## 2017-08-12 ENCOUNTER — Telehealth: Payer: Self-pay | Admitting: Urology

## 2017-08-12 ENCOUNTER — Ambulatory Visit (INDEPENDENT_AMBULATORY_CARE_PROVIDER_SITE_OTHER): Payer: BLUE CROSS/BLUE SHIELD | Admitting: Urology

## 2017-08-12 ENCOUNTER — Encounter: Payer: Self-pay | Admitting: Urology

## 2017-08-12 ENCOUNTER — Ambulatory Visit: Payer: BLUE CROSS/BLUE SHIELD

## 2017-08-12 VITALS — BP 151/76 | HR 69 | Ht 73.0 in | Wt 233.3 lb

## 2017-08-12 DIAGNOSIS — N5231 Erectile dysfunction following radical prostatectomy: Secondary | ICD-10-CM | POA: Diagnosis not present

## 2017-08-12 DIAGNOSIS — N393 Stress incontinence (female) (male): Secondary | ICD-10-CM | POA: Diagnosis not present

## 2017-08-12 DIAGNOSIS — C61 Malignant neoplasm of prostate: Secondary | ICD-10-CM | POA: Diagnosis not present

## 2017-08-12 MED ORDER — SILDENAFIL CITRATE 20 MG PO TABS: ORAL_TABLET | ORAL | 6 refills | Status: DC

## 2017-08-12 MED ORDER — TADALAFIL 20 MG PO TABS: 20.0000 mg | ORAL_TABLET | Freq: Every day | ORAL | 11 refills | Status: DC | PRN

## 2017-08-12 NOTE — Telephone Encounter (Signed)
Please let the his pharmacy know that it is okay to fill with the generic MUSE.

## 2017-08-13 NOTE — Telephone Encounter (Signed)
Left detailed pharmacy message.

## 2017-08-23 ENCOUNTER — Other Ambulatory Visit: Payer: Self-pay

## 2017-08-23 MED ORDER — ALPROSTADIL (VASODILATOR) 500 MCG UR PLLT: PELLET | URETHRAL | 0 refills | Status: DC

## 2017-09-03 DIAGNOSIS — I10 Essential (primary) hypertension: Secondary | ICD-10-CM | POA: Diagnosis not present

## 2017-09-03 DIAGNOSIS — Z0000 Encounter for general adult medical examination without abnormal findings: Secondary | ICD-10-CM | POA: Diagnosis not present

## 2017-09-16 ENCOUNTER — Ambulatory Visit: Payer: BLUE CROSS/BLUE SHIELD | Admitting: Endocrinology

## 2017-09-16 ENCOUNTER — Encounter: Payer: Self-pay | Admitting: Endocrinology

## 2017-09-16 VITALS — BP 142/80 | HR 65 | Ht 73.0 in | Wt 233.0 lb

## 2017-09-16 DIAGNOSIS — I10 Essential (primary) hypertension: Secondary | ICD-10-CM

## 2017-09-16 LAB — BASIC METABOLIC PANEL
BUN: 17 mg/dL (ref 6–23)
CO2: 29 mEq/L (ref 19–32)
CREATININE: 1.14 mg/dL (ref 0.40–1.50)
Calcium: 9.6 mg/dL (ref 8.4–10.5)
Chloride: 100 mEq/L (ref 96–112)
GFR: 85.13 mL/min (ref >60.00)
GLUCOSE: 93 mg/dL (ref 70–99)
Potassium: 3.6 mEq/L (ref 3.5–5.1)
Sodium: 136 mEq/L (ref 135–145)

## 2017-09-16 MED ORDER — HYDROCHLOROTHIAZIDE 25 MG PO TABS: 25.0000 mg | ORAL_TABLET | Freq: Three times a day (TID) | ORAL | 3 refills | Status: DC

## 2017-09-16 NOTE — Progress Notes (Signed)
Subjective:    Patient ID: Garrett Watkins, male    DOB: Jul 25, 1960, 57 y.o.   MRN: [id number]  HPI  The state of at least three ongoing medical problems is addressed today, with interval history of each noted here: Pt returns for f/u of elev aldo/PRA ratio (HTN was dx'ed in 2008; hypokalemia was first noted in 2018; in Dec of 2017, he was in the hospital for severe HTN; he was rxed eplerenone in Nov of 2018; adrenals were normal on 12/18 CT).  Denies chest pain.  This is a stable problem. ED sxs since prostate surgery.  He seldom takes the cialis, but it helps.  This is a stable problem. HTN: denies dizziness.  This is a stable problem. Renal insuff: Denies headache and sob.  This is a stable problem. Past Medical History:  Diagnosis Date  . BPH (benign prostatic hyperplasia)   . Colon adenoma   . Dyslipidemia   . GERD (gastroesophageal reflux disease)    history of  . Hypertension   . OSA (obstructive sleep apnea)   . Prostate cancer [facility])     Past Surgical History:  Procedure Laterality Date  . CARDIAC CATHETERIZATION Right 01/24/2016   Procedure: Left Heart Cath and Coronary Angiography;  Surgeon: Corey Skains, MD;  Location: [place] [facility];  Service: Cardiovascular;  Laterality: Right;  . CYST EXCISION    . LYMPHADENECTOMY Bilateral 04/21/2016   Procedure: PELVIC LYMPHADENECTOMY;  Surgeon: Alexis Frock, MD;  Location: [facility] ORS;  Service: Urology;  Laterality: Bilateral;  . NO PAST SURGERIES    . ROBOT ASSISTED LAPAROSCOPIC RADICAL PROSTATECTOMY N/A 04/21/2016   Procedure: ROBOTIC ASSISTED LAPAROSCOPIC RADICAL PROSTATECTOMY;  Surgeon: Alexis Frock, MD;  Location: [facility] ORS;  Service: Urology;  Laterality: N/A;  . VASECTOMY      Social History   Socioeconomic History  . Marital status: Married    Spouse name: Not on file  . Number of children: Not on file  . Years of education: Not on file  . Highest education level: Not on file  Occupational History  . Not on  file  Social Needs  . Financial resource strain: Not on file  . Food insecurity:    Worry: Not on file    Inability: Not on file  . Transportation needs:    Medical: Not on file    Non-medical: Not on file  Tobacco Use  . Smoking status: Never Smoker  . Smokeless tobacco: Never Used  Substance and Sexual Activity  . Alcohol use: No  . Drug use: No  . Sexual activity: Yes  Lifestyle  . Physical activity:    Days per week: Not on file    Minutes per session: Not on file  . Stress: Not on file  Relationships  . Social connections:    Talks on phone: Not on file    Gets together: Not on file    Attends religious service: Not on file    Active member of club or organization: Not on file    Attends meetings of clubs or organizations: Not on file    Relationship status: Not on file  . Intimate partner violence:    Fear of current or ex partner: Not on file    Emotionally abused: Not on file    Physically abused: Not on file    Forced sexual activity: Not on file  Other Topics Concern  . Not on file  Social History Narrative  . Not on file  Current Outpatient Medications on File Prior to Visit  Medication Sig Dispense Refill  . Alprostadil, Vasodilator, (MUSE) 500 MCG PLLT PLACE THE PELLET INTO THE URETHRA 30 MINUTES PRIOR TO INTERCOURSE. 30 each 0  . amLODipine (NORVASC) 5 MG tablet Take 5 mg daily by mouth.  11  . atorvastatin (LIPITOR) 80 MG tablet Take by mouth.    Marland Kitchen eplerenone (INSPRA) 50 MG tablet Take 1 tablet (50 mg total) by mouth 2 (two) times daily. 60 tablet 11  . hydrALAZINE (APRESOLINE) 25 MG tablet TK 1 T PO TID  6  . ibuprofen (ADVIL,MOTRIN) 200 MG tablet Take 200 mg by mouth every 6 (six) hours as needed.    . Multiple Vitamin (MULTIVITAMIN WITH MINERALS) TABS tablet Take 1 tablet by mouth daily.    . sildenafil (REVATIO) 20 MG tablet Take 1 to 5 tabs PO daily prn 60 tablet 6  . tadalafil (ADCIRCA/CIALIS) 20 MG tablet Take 1 tablet (20 mg total) by mouth  daily as needed for erectile dysfunction. 6 tablet 11  . telmisartan (MICARDIS) 40 MG tablet Take 1 tablet (40 mg total) by mouth daily. 90 tablet 3  . TONALIN SAFFLOWER OIL CLA PO Take 800 mg by mouth 2 (two) times daily.     No current facility-administered medications on file prior to visit.     Allergies  Allergen Reactions  . Amlodipine Other (See Comments)    Swelling in ankles (edema)---with higher doses per patient.    Family History  Problem Relation Age of Onset  . CAD Mother   . Prostate cancer Neg Hx   . Adrenal disorder Neg Hx     BP (!) 142/80 (BP Location: Left Arm, Patient Position: Sitting)   Pulse 65   Ht 6\' 1"  (1.854 m)   Wt 233 lb (105.7 kg)   SpO2 97%   BMI 30.74 kg/m    Review of Systems Denies decreased urinary stream.      Objective:   Physical Exam VITAL SIGNS:  See vs page GENERAL: no distress Ext: no leg edema.       Assessment & Plan:  elev aldo/PRA ratio: he is in max rx HTN: he needs increased rx Renal insuff: recheck today ED: cialis is a relative contraindication to adding nitrate for HTN    Patient Instructions  I have sent a prescription to your pharmacy, to double the hydralazine Please continue the same other medications blood tests are requested for you today.  We'll let you know about the results. Please come back for a follow-up appointment in 6 months.

## 2017-09-16 NOTE — Patient Instructions (Signed)
I have sent a prescription to your pharmacy, to double the hydralazine Please continue the same other medications blood tests are requested for you today.  We'll let you know about the results. Please come back for a follow-up appointment in 6 months.

## 2017-09-30 DIAGNOSIS — Z86010 Personal history of colonic polyps: Secondary | ICD-10-CM | POA: Diagnosis not present

## 2017-10-28 ENCOUNTER — Encounter: Payer: Self-pay | Admitting: Endocrinology

## 2017-10-29 ENCOUNTER — Other Ambulatory Visit: Payer: Self-pay

## 2017-10-29 MED ORDER — AMLODIPINE BESYLATE 5 MG PO TABS: 5.0000 mg | ORAL_TABLET | Freq: Every day | ORAL | 5 refills | Status: DC | PRN

## 2017-11-03 DIAGNOSIS — T485X5A Adverse effect of other anti-common-cold drugs, initial encounter: Secondary | ICD-10-CM | POA: Diagnosis not present

## 2017-11-03 DIAGNOSIS — Z23 Encounter for immunization: Secondary | ICD-10-CM | POA: Diagnosis not present

## 2017-11-03 DIAGNOSIS — J310 Chronic rhinitis: Secondary | ICD-10-CM | POA: Diagnosis not present

## 2017-11-03 DIAGNOSIS — J329 Chronic sinusitis, unspecified: Secondary | ICD-10-CM | POA: Diagnosis not present

## 2017-11-26 DIAGNOSIS — D128 Benign neoplasm of rectum: Secondary | ICD-10-CM | POA: Diagnosis not present

## 2017-11-26 DIAGNOSIS — D124 Benign neoplasm of descending colon: Secondary | ICD-10-CM | POA: Diagnosis not present

## 2017-11-26 DIAGNOSIS — K621 Rectal polyp: Secondary | ICD-10-CM | POA: Diagnosis not present

## 2017-11-26 DIAGNOSIS — K640 First degree hemorrhoids: Secondary | ICD-10-CM | POA: Diagnosis not present

## 2017-11-26 DIAGNOSIS — Z86010 Personal history of colonic polyps: Secondary | ICD-10-CM | POA: Diagnosis not present

## 2017-11-26 DIAGNOSIS — Z1211 Encounter for screening for malignant neoplasm of colon: Secondary | ICD-10-CM | POA: Diagnosis not present

## 2018-02-11 ENCOUNTER — Other Ambulatory Visit: Payer: Self-pay | Admitting: Family Medicine

## 2018-02-11 DIAGNOSIS — C61 Malignant neoplasm of prostate: Secondary | ICD-10-CM

## 2018-02-14 ENCOUNTER — Other Ambulatory Visit: Payer: BLUE CROSS/BLUE SHIELD

## 2018-02-14 DIAGNOSIS — C61 Malignant neoplasm of prostate: Secondary | ICD-10-CM

## 2018-02-15 LAB — PSA

## 2018-02-16 ENCOUNTER — Encounter: Payer: Self-pay | Admitting: Urology

## 2018-02-16 ENCOUNTER — Ambulatory Visit: Payer: BLUE CROSS/BLUE SHIELD | Admitting: Urology

## 2018-02-16 VITALS — BP 137/81 | HR 83 | Ht 73.0 in | Wt 236.0 lb

## 2018-02-16 DIAGNOSIS — N393 Stress incontinence (female) (male): Secondary | ICD-10-CM

## 2018-02-16 DIAGNOSIS — N5231 Erectile dysfunction following radical prostatectomy: Secondary | ICD-10-CM

## 2018-02-16 DIAGNOSIS — C61 Malignant neoplasm of prostate: Secondary | ICD-10-CM | POA: Diagnosis not present

## 2018-02-16 MED ORDER — ALPROSTADIL (VASODILATOR) 500 MCG UR PLLT: PELLET | URETHRAL | 3 refills | Status: DC

## 2018-02-16 MED ORDER — NONFORMULARY OR COMPOUNDED ITEM: 0 refills | Status: DC

## 2018-02-16 NOTE — Progress Notes (Signed)
02/16/2018  10:34 AM   Garrett Watkins 08-08-1960 [id number]  Referring provider: Kirk Ruths, MD [place] [facility] [place], [place] [address]  Chief Complaint  Patient presents with  . Prostate Cancer    HPI: 58 yo AAM with moderate risk prostate cancer, SUI and ED who presents today for follow up.    Moderate RiskProstate cancer History of s/p robotic prostatectomy 04/2016 for pT2N0Mx Gleason 4+3=7 adenocarcinoma with NEGATIVE margins. Pre-op PSA 4.1. Post-op Course: 07/2016 - PSA < 0.1 01/2017 - PSA < 0.1 07/2017 - PSA < 0.1 02/2018 - PSA < 0.1  SUI (stress urinary incontinence), male History of s/p prostatectomy 04/2016.At 9 mos down to 0-1 pads.   He reports his urinary incontinence is under control and only worsens when he drinks sodas. He reports of drinking coffee and sweet tea and regularly doing Kegal exercises. He has seen improvement in urinary symptoms and is not currently using pads. He is pleased with his improvement in urinary symptoms.   Erectile dysfunction after radical prostatectomy History of s/p prostatectomy 04/2016.At 3 and 9 mos getting some fullness with stimulation but not adequate to penetrate, no nitrates.  No significant cardiac history except for hypertension.  No history of MI. Able to walk up a flight of stairs without difficulty.  He was  started on sildenafil which at maximum doses had no effect.  He also tried Cialis but this was cost prohibitive. He is not comfortable with the intracavernousal injections.    He is currently injecting 20 mg Edex and stated that it is effective however unable to tolerate pain. He denies curvature or pain with erection. He denies any plaques.   PMH: Past Medical History:  Diagnosis Date  . BPH (benign prostatic hyperplasia)   . Colon adenoma   . Dyslipidemia   . GERD (gastroesophageal reflux disease)    history of  . Hypertension   . OSA (obstructive sleep apnea)   . Prostate  cancer [facility])     Surgical History: Past Surgical History:  Procedure Laterality Date  . CARDIAC CATHETERIZATION Right 01/24/2016   Procedure: Left Heart Cath and Coronary Angiography;  Surgeon: Corey Skains, MD;  Location: [place] [facility];  Service: Cardiovascular;  Laterality: Right;  . CYST EXCISION    . LYMPHADENECTOMY Bilateral 04/21/2016   Procedure: PELVIC LYMPHADENECTOMY;  Surgeon: Alexis Frock, MD;  Location: [facility] ORS;  Service: Urology;  Laterality: Bilateral;  . NO PAST SURGERIES    . ROBOT ASSISTED LAPAROSCOPIC RADICAL PROSTATECTOMY N/A 04/21/2016   Procedure: ROBOTIC ASSISTED LAPAROSCOPIC RADICAL PROSTATECTOMY;  Surgeon: Alexis Frock, MD;  Location: [facility] OR[facility];  Service: Urology;  Laterality: N/A;  . VASECTOMY      Home Medications:  Allergies as of 02/16/2018      Reactions   Amlodipine Other (See Comments)   Swelling in ankles (edema)---with higher doses per patient.      Medication List       Accurate as of February 16, 2018 10:34 AM. Always use your most recent med list.        Alprostadil (Vasodilator) 500 MCG Pllt Commonly known as:  MUSE PLACE THE PELLET INTO THE URETHRA 30 MINUTES PRIOR TO INTERCOURSE.   amLODipine 5 MG tablet Commonly known as:  NORVASC Take 1 tablet (5 mg total) by mouth daily as needed.   atorvastatin 80 MG tablet Commonly known as:  LIPITOR Take by mouth.   eplerenone 50 MG tablet Commonly known as:  INSPRA  Take 1 tablet (50 mg total) by mouth 2 (two) times daily.   fluticasone 50 MCG/ACT nasal spray Commonly known as:  FLONASE SPRAY 2 SPRAYS INTO EACH NOSTRIL EVERY DAY   hydrALAZINE 25 MG tablet Commonly known as:  APRESOLINE TK 1 T PO TID   hydrochlorothiazide 25 MG tablet Commonly known as:  HYDRODIURIL Take 1 tablet (25 mg total) by mouth 3 (three) times daily.   ibuprofen 200 MG tablet Commonly known as:  ADVIL,MOTRIN Take 200 mg by mouth every 6 (six) hours as needed.   multivitamin with minerals Tabs  tablet Take 1 tablet by mouth daily.   sildenafil 20 MG tablet Commonly known as:  REVATIO Take 1 to 5 tabs PO daily prn   tadalafil 20 MG tablet Commonly known as:  ADCIRCA/CIALIS Take 1 tablet (20 mg total) by mouth daily as needed for erectile dysfunction.   telmisartan 40 MG tablet Commonly known as:  MICARDIS Take 1 tablet (40 mg total) by mouth daily.   TONALIN SAFFLOWER OIL CLA PO Take 800 mg by mouth 2 (two) times daily.       Allergies:  Allergies  Allergen Reactions  . Amlodipine Other (See Comments)    Swelling in ankles (edema)---with higher doses per patient.    Family History: Family History  Problem Relation Age of Onset  . CAD Mother   . Prostate cancer Neg Hx   . Adrenal disorder Neg Hx     Social History:  reports that he has never smoked. He has never used smokeless tobacco. He reports that he does not drink alcohol or use drugs.  ROS: UROLOGY Frequent Urination?: No Hard to postpone urination?: No Burning/pain with urination?: No Get up at night to urinate?: No Leakage of urine?: No Urine stream starts and stops?: No Trouble starting stream?: No Do you have to strain to urinate?: No Blood in urine?: No Urinary tract infection?: No Sexually transmitted disease?: No Injury to kidneys or bladder?: No Painful intercourse?: No Weak stream?: No Erection problems?: Yes Penile pain?: No  Gastrointestinal Nausea?: No Vomiting?: No Indigestion/heartburn?: No Diarrhea?: No Constipation?: No  Constitutional Fever: No Night sweats?: No Weight loss?: No Fatigue?: No  Skin Skin rash/lesions?: No Itching?: No  Eyes Blurred vision?: No Double vision?: No  Ears/Nose/Throat Sore throat?: No Sinus problems?: No  Hematologic/Lymphatic Swollen glands?: No Easy bruising?: No  Cardiovascular Leg swelling?: No Chest pain?: No  Respiratory Cough?: No Shortness of breath?: No  Endocrine Excessive thirst?:  No  Musculoskeletal Back pain?: No Joint pain?: No  Neurological Headaches?: No Dizziness?: No  Psychologic Depression?: No Anxiety?: No  Physical Exam: BP 137/81 (BP Location: Left Arm, Patient Position: Sitting)   Pulse 83   Ht 6\' 1"  (1.854 m)   Wt 236 lb (107 kg)   BMI 31.14 kg/m   Constitutional:  Well nourished. Alert and oriented, No acute distress. HEENT: [place] AT, moist mucus membranes.  Trachea midline, no masses. Cardiovascular: No clubbing, cyanosis, or edema. Respiratory: Normal respiratory effort, no increased work of breathing.  GU: No CVA tenderness.  No bladder fullness or masses.  Patient with uncircumcised phallus. Foreskin easily retracted. Urethral meatus is patent.  No penile discharge. No penile lesions or rashes. Scrotum without lesions, cysts, rashes and/or edema.  Testicles are located scrotally bilaterally. No masses are appreciated in the testicles. Left and right epididymis are normal. Skin: No rashes, bruises or suspicious lesions. Neurologic: Grossly intact, no focal deficits, moving all 4 extremities. Psychiatric: Normal mood and  affect.  Laboratory Data: Component     Latest Ref Rng & Units 11/27/2015 08/04/2016 02/08/2017 08/10/2017  Prostate Specific Ag, Serum     0.0 - 4.0 ng/mL 4.1 (H) <0.1 <0.1 <0.1   Component     Latest Ref Rng & Units 02/14/2018  Prostate Specific Ag, Serum     0.0 - 4.0 ng/mL <0.1   Assessment & Plan:    1. Prostate cancer [facility])- excellent prognosis with node negative, margin negative disease and PSA now undetectable. PSA undetectable, start yearly PSA check  RTC in 1 year office visit with PSA prior   2. SUI (stress urinary incontinence), male- doing well, reinforced importance of Kegel's and time course for improvement.  3. Erectile dysfunction after radical prostatectomy Currently usIng Edex, effective however unable to tolerate pain Rx of MUSE sent to pharmacy as he did not try the prescription that was  given before Rx of Trimix sent; Discussed potential risks including priapism and corporal scaring; Not recommended to use more than 2-3x weekly Pt agreeable to treatment   [place] [address] [place], [place] [address] [phone]  I, Lucas Mallow, am acting as a scribe for [employer],  I have reviewed the above documentation for accuracy and completeness, and I agree with the above.    Zara Council, PA-C

## 2018-02-21 ENCOUNTER — Other Ambulatory Visit: Payer: Self-pay | Admitting: Family Medicine

## 2018-02-21 MED ORDER — ALPROSTADIL (VASODILATOR) 500 MCG UR PLLT: PELLET | URETHRAL | 3 refills | Status: DC

## 2018-02-26 ENCOUNTER — Other Ambulatory Visit: Payer: Self-pay | Admitting: Endocrinology

## 2018-03-04 ENCOUNTER — Other Ambulatory Visit: Payer: Self-pay | Admitting: Urology

## 2018-03-04 MED ORDER — ALPROSTADIL (VASODILATOR) 1000 MCG UR PLLT: 1000.0000 ug | PELLET | URETHRAL | 12 refills | Status: DC | PRN

## 2018-03-04 NOTE — Progress Notes (Signed)
Prescription for MUSE 1000 send to [facility] in [place].

## 2018-03-17 ENCOUNTER — Ambulatory Visit: Payer: BLUE CROSS/BLUE SHIELD | Admitting: Endocrinology

## 2018-03-21 ENCOUNTER — Encounter: Payer: Self-pay | Admitting: Endocrinology

## 2018-03-21 ENCOUNTER — Ambulatory Visit: Payer: BLUE CROSS/BLUE SHIELD | Admitting: Endocrinology

## 2018-03-21 ENCOUNTER — Other Ambulatory Visit: Payer: Self-pay

## 2018-03-21 VITALS — BP 130/78 | HR 75 | Ht 73.0 in | Wt 235.2 lb

## 2018-03-21 DIAGNOSIS — R7989 Other specified abnormal findings of blood chemistry: Secondary | ICD-10-CM

## 2018-03-21 DIAGNOSIS — R799 Abnormal finding of blood chemistry, unspecified: Secondary | ICD-10-CM | POA: Diagnosis not present

## 2018-03-21 LAB — BASIC METABOLIC PANEL
BUN: 19 mg/dL (ref 6–23)
CHLORIDE: 100 meq/L (ref 96–112)
CO2: 29 mEq/L (ref 19–32)
Calcium: 10 mg/dL (ref 8.4–10.5)
Creatinine, Ser: 1.14 mg/dL (ref 0.40–1.50)
GFR: 79.95 mL/min (ref >60.00)
Glucose, Bld: 88 mg/dL (ref 70–99)
POTASSIUM: 3.9 meq/L (ref 3.5–5.1)
Sodium: 137 mEq/L (ref 135–145)

## 2018-03-21 LAB — LIPID PANEL
CHOL/HDL RATIO: 3
CHOLESTEROL: 137 mg/dL (ref 0–200)
HDL: 45.1 mg/dL (ref >39.00)
LDL Cholesterol: 67 mg/dL (ref 0–99)
NonHDL: 91.52
TRIGLYCERIDES: 124 mg/dL (ref 0.0–149.0)
VLDL: 24.8 mg/dL (ref 0.0–40.0)

## 2018-03-21 NOTE — Progress Notes (Signed)
Subjective:    Patient ID: Garrett Watkins, male    DOB: [birth year], 58 y.o.   MRN: [id number]  HPI The state of at least three ongoing medical problems is addressed today, with interval history of each noted here: Pt returns for f/u of elev aldo/PRA ratio (HTN was dx'ed in 2008; hypokalemia was first noted in 2018; in Dec of 2017, he was in the hospital for severe HTN; he was rxed eplerenone in Nov of 2018--he is now on max dosage; adrenals were normal on 12/18 CT).  Denies LOC.  This is a stable problem.   ED sxs since prostate surgery.  neither cialis nor viagra helps.  This is a stable problem.   HTN: denies dizziness.  He stopped norvasc 3 weeks ago, in an effort to minimize meds.  This is a stable problem.   Renal insuff: Denies headache.  This is a stable problem.   Past Medical History:  Diagnosis Date  . BPH (benign prostatic hyperplasia)   . Colon adenoma   . Dyslipidemia   . GERD (gastroesophageal reflux disease)    history of  . Hypertension   . OSA (obstructive sleep apnea)   . Prostate cancer [facility])     Past Surgical History:  Procedure Laterality Date  . CARDIAC CATHETERIZATION Right 01/24/2016   Procedure: Left Heart Cath and Coronary Angiography;  Surgeon: Corey Skains, MD;  Location: [place] [facility];  Service: Cardiovascular;  Laterality: Right;  . CYST EXCISION    . LYMPHADENECTOMY Bilateral 04/21/2016   Procedure: PELVIC LYMPHADENECTOMY;  Surgeon: Alexis Frock, MD;  Location: [facility] ORS;  Service: Urology;  Laterality: Bilateral;  . NO PAST SURGERIES    . ROBOT ASSISTED LAPAROSCOPIC RADICAL PROSTATECTOMY N/A 04/21/2016   Procedure: ROBOTIC ASSISTED LAPAROSCOPIC RADICAL PROSTATECTOMY;  Surgeon: Alexis Frock, MD;  Location: [facility] ORS;  Service: Urology;  Laterality: N/A;  . VASECTOMY      Social History   Socioeconomic History  . Marital status: Married    Spouse name: Not on file  . Number of children: Not on file  . Years of education: Not on file  .  Highest education level: Not on file  Occupational History  . Not on file  Social Needs  . Financial resource strain: Not on file  . Food insecurity:    Worry: Not on file    Inability: Not on file  . Transportation needs:    Medical: Not on file    Non-medical: Not on file  Tobacco Use  . Smoking status: Never Smoker  . Smokeless tobacco: Never Used  Substance and Sexual Activity  . Alcohol use: No  . Drug use: No  . Sexual activity: Yes  Lifestyle  . Physical activity:    Days per week: Not on file    Minutes per session: Not on file  . Stress: Not on file  Relationships  . Social connections:    Talks on phone: Not on file    Gets together: Not on file    Attends religious service: Not on file    Active member of club or organization: Not on file    Attends meetings of clubs or organizations: Not on file    Relationship status: Not on file  . Intimate partner violence:    Fear of current or ex partner: Not on file    Emotionally abused: Not on file    Physically abused: Not on file    Forced sexual activity: Not on file  Other Topics Concern  . Not on file  Social History Narrative  . Not on file    Current Outpatient Medications on File Prior to Visit  Medication Sig Dispense Refill  . alprostadil (MUSE) 1000 MCG pellet 1 each (1,000 mcg total) by Transurethral route as needed for erectile dysfunction. use no more than 3 times per week 6 each 12  . atorvastatin (LIPITOR) 80 MG tablet Take by mouth.    Marland Kitchen eplerenone (INSPRA) 50 MG tablet TAKE 1 TABLET BY MOUTH TWICE A DAY 180 tablet 3  . fluticasone (FLONASE) 50 MCG/ACT nasal spray SPRAY 2 SPRAYS INTO EACH NOSTRIL EVERY DAY    . hydrALAZINE (APRESOLINE) 25 MG tablet TK 1 T PO TID  6  . hydrochlorothiazide (HYDRODIURIL) 25 MG tablet Take 1 tablet (25 mg total) by mouth 3 (three) times daily. 90 tablet 3  . ibuprofen (ADVIL,MOTRIN) 200 MG tablet Take 200 mg by mouth every 6 (six) hours as needed.    . Multiple  Vitamin (MULTIVITAMIN WITH MINERALS) TABS tablet Take 1 tablet by mouth daily.    . NONFORMULARY OR COMPOUNDED ITEM Trimix (30/1/10)-(Pap/Phent/PGE) Dosage: Inject 3 cc per injection 3cc size vial Qty 1 Refills 3 [place] [phone] Fax [phone] 1 each 0  . sildenafil (REVATIO) 20 MG tablet Take 1 to 5 tabs PO daily prn 60 tablet 6  . tadalafil (ADCIRCA/CIALIS) 20 MG tablet Take 1 tablet (20 mg total) by mouth daily as needed for erectile dysfunction. 6 tablet 11  . telmisartan (MICARDIS) 40 MG tablet Take 1 tablet (40 mg total) by mouth daily. 90 tablet 3  . TONALIN SAFFLOWER OIL CLA PO Take 800 mg by mouth 2 (two) times daily.     No current facility-administered medications on file prior to visit.     Allergies  Allergen Reactions  . Amlodipine Other (See Comments)    Swelling in ankles (edema)---with higher doses per patient.    Family History  Problem Relation Age of Onset  . CAD Mother   . Prostate cancer Neg Hx   . Adrenal disorder Neg Hx     BP 130/78 (BP Location: Right Arm, Patient Position: Sitting, Cuff Size: Normal)   Pulse 75   Ht 6\' 1"  (1.854 m)   Wt 235 lb 3.2 oz (106.7 kg)   SpO2 97%   BMI 31.03 kg/m   Review of Systems Denies CP and sob    Objective:   Physical Exam VITAL SIGNS:  See vs page GENERAL: no distress LUNGS:  Clear to auscultation HEART:  Regular rate and rhythm without murmurs noted. Normal S1,S2.    Lab Results  Component Value Date   CREATININE 1.14 03/21/2018   BUN 19 03/21/2018   NA 137 03/21/2018   K 3.9 03/21/2018   CL 100 03/21/2018   CO2 29 03/21/2018       Assessment & Plan:  HTN: well-controlled off norvasc. renal insuff: stable elev aldo/PRA ratio: on max rx.   Patient Instructions  You can stay off the amlodipine.  Please continue the same other medications.   blood tests are requested for you today.  We'll let you know about the results. Please come back for a follow-up appointment in 6  months.

## 2018-03-21 NOTE — Patient Instructions (Addendum)
You can stay off the amlodipine.  Please continue the same other medications.   blood tests are requested for you today.  We'll let you know about the results. Please come back for a follow-up appointment in 6 months.

## 2018-03-30 ENCOUNTER — Other Ambulatory Visit: Payer: Self-pay | Admitting: Endocrinology

## 2018-03-31 ENCOUNTER — Encounter: Payer: Self-pay | Admitting: Endocrinology

## 2018-03-31 ENCOUNTER — Other Ambulatory Visit: Payer: Self-pay

## 2018-03-31 DIAGNOSIS — R7989 Other specified abnormal findings of blood chemistry: Secondary | ICD-10-CM

## 2018-03-31 DIAGNOSIS — R799 Abnormal finding of blood chemistry, unspecified: Principal | ICD-10-CM

## 2018-03-31 MED ORDER — TELMISARTAN 40 MG PO TABS: 40.0000 mg | ORAL_TABLET | Freq: Every day | ORAL | 3 refills | Status: DC

## 2018-04-27 ENCOUNTER — Other Ambulatory Visit: Payer: Self-pay | Admitting: Endocrinology

## 2018-05-22 ENCOUNTER — Emergency Department
Admission: EM | Admit: 2018-05-22 | Discharge: 2018-05-22 | Disposition: A | Discharge status: Home / Self Care | Payer: BLUE CROSS/BLUE SHIELD | Attending: Emergency Medicine | Admitting: Emergency Medicine

## 2018-05-22 ENCOUNTER — Emergency Department: Payer: BLUE CROSS/BLUE SHIELD

## 2018-05-22 ENCOUNTER — Other Ambulatory Visit: Payer: Self-pay

## 2018-05-22 DIAGNOSIS — R202 Paresthesia of skin: Secondary | ICD-10-CM | POA: Insufficient documentation

## 2018-05-22 DIAGNOSIS — I10 Essential (primary) hypertension: Secondary | ICD-10-CM | POA: Diagnosis not present

## 2018-05-22 DIAGNOSIS — R0789 Other chest pain: Secondary | ICD-10-CM | POA: Insufficient documentation

## 2018-05-22 DIAGNOSIS — E785 Hyperlipidemia, unspecified: Secondary | ICD-10-CM | POA: Diagnosis not present

## 2018-05-22 DIAGNOSIS — R079 Chest pain, unspecified: Secondary | ICD-10-CM

## 2018-05-22 LAB — BASIC METABOLIC PANEL
Anion gap: 8 (ref 5–15)
BUN: 15 mg/dL (ref 6–20)
CO2: 28 mmol/L (ref 22–32)
Calcium: 9.6 mg/dL (ref 8.9–10.3)
Chloride: 103 mmol/L (ref 98–111)
Creatinine, Ser: 1.17 mg/dL (ref 0.61–1.24)
GFR calc Af Amer: >60 mL/min (ref >60)
GFR calc non Af Amer: >60 mL/min (ref >60)
Glucose, Bld: 96 mg/dL (ref 70–99)
Potassium: 3.8 mmol/L (ref 3.5–5.1)
Sodium: 139 mmol/L (ref 135–145)

## 2018-05-22 LAB — CBC
HCT: 38.4 % — ABNORMAL LOW (ref 39.0–52.0)
Hemoglobin: 12.3 g/dL — ABNORMAL LOW (ref 13.0–17.0)
MCH: 28.7 pg (ref 26.0–34.0)
MCHC: 32 g/dL (ref 30.0–36.0)
MCV: 89.5 fL (ref 80.0–100.0)
Platelets: 220 10*3/uL (ref 150–400)
RBC: 4.29 MIL/uL (ref 4.22–5.81)
RDW: 13.3 % (ref 11.5–15.5)
WBC: 5.5 10*3/uL (ref 4.0–10.5)
nRBC: 0 % (ref 0.0–0.2)

## 2018-05-22 LAB — TROPONIN I: Troponin I: <0.03 ng/mL (ref <0.03)

## 2018-05-22 MED ORDER — SODIUM CHLORIDE 0.9% FLUSH: 3.0000 mL | Freq: Once | INTRAVENOUS | Status: DC

## 2018-05-22 NOTE — ED Triage Notes (Signed)
Pt c/o of Left sided chest pain with numbness and tingling around Left side of face and around mouth which started about an hour and a half ago. States bp is sometimes elevated.

## 2018-05-22 NOTE — ED Notes (Signed)
Pt standing in room putting on gown, A&O x 4. Reports no pain in chest but does report some pressure.

## 2018-05-22 NOTE — ED Provider Notes (Signed)
[facility] Emergency Department Provider Note       Time seen: ----------------------------------------- 4:44 PM on 05/22/2018 -----------------------------------------   I have reviewed the triage vital signs and the nursing notes.  HISTORY   Chief Complaint Hypertension and Chest Pain    HPI Garrett Watkins is a 58 y.o. male with a history of hyperlipidemia, GERD, hypertension, prostate cancer who presents to the ED for left-sided chest pain with numbness and tingling to the left side of his face around his mouth which started about an hour and a half ago.  Patient reports his blood pressure is slightly elevated compared to normal.  Describes the chest pain as pressure.  The tingling has resolved at this time.  He did not have any true numbness.  Past Medical History:  Diagnosis Date  . BPH (benign prostatic hyperplasia)   . Colon adenoma   . Dyslipidemia   . GERD (gastroesophageal reflux disease)    history of  . Hypertension   . OSA (obstructive sleep apnea)   . Prostate cancer [facility])     Patient Active Problem List   Diagnosis Date Noted  . Abnormal aldosterone to renin ratio 11/26/2016  . SUI (stress urinary incontinence), male 08/11/2016  . Erectile dysfunction after radical prostatectomy 08/11/2016  . Prostate cancer ([place]) 02/24/2016  . Urinary urgency 02/24/2016  . Chest pain 01/24/2016  . NSTEMI (non-ST elevated myocardial infarction) ([place]) 01/24/2016  . Urinary frequency 12/30/2015  . Dyslipidemia 11/27/2015  . Essential hypertension 11/27/2015  . Hyperlipidemia 11/27/2015  . Hypertension 11/27/2015  . Snoring 11/27/2015  . Colon adenoma 01/21/2012    Past Surgical History:  Procedure Laterality Date  . CARDIAC CATHETERIZATION Right 01/24/2016   Procedure: Left Heart Cath and Coronary Angiography;  Surgeon: Corey Skains, MD;  Location: [place] CV LAB;  Service: Cardiovascular;  Laterality: Right;  . CYST EXCISION    .  LYMPHADENECTOMY Bilateral 04/21/2016   Procedure: PELVIC LYMPHADENECTOMY;  Surgeon: Alexis Frock, MD;  Location: [facility] ORS;  Service: Urology;  Laterality: Bilateral;  . NO PAST SURGERIES    . ROBOT ASSISTED LAPAROSCOPIC RADICAL PROSTATECTOMY N/A 04/21/2016   Procedure: ROBOTIC ASSISTED LAPAROSCOPIC RADICAL PROSTATECTOMY;  Surgeon: Alexis Frock, MD;  Location: [facility] ORS;  Service: Urology;  Laterality: N/A;  . VASECTOMY      Allergies Amlodipine  Social History Social History   Tobacco Use  . Smoking status: Never Smoker  . Smokeless tobacco: Never Used  Substance Use Topics  . Alcohol use: No  . Drug use: No   Review of Systems Constitutional: Negative for fever. Cardiovascular: Positive for chest pressure Respiratory: Negative for shortness of breath. Gastrointestinal: Negative for abdominal pain, vomiting and diarrhea. Musculoskeletal: Negative for back pain. Skin: Negative for rash. Neurological: Negative for headaches, positive for facial paresthesia  All systems negative/normal/unremarkable except as stated in the HPI  ____________________________________________   PHYSICAL EXAM:  VITAL SIGNS: ED Triage Vitals  Enc Vitals Group     BP 05/22/18 1424 (!) 143/72     Pulse Rate 05/22/18 1424 65     Resp 05/22/18 1424 16     Temp 05/22/18 1424 98.6 F (37 C)     Temp Source 05/22/18 1424 Oral     SpO2 05/22/18 1424 100 %     Weight 05/22/18 1429 230 lb (104.3 kg)     Height 05/22/18 1429 6\' 1"  (1.854 m)     Head Circumference --      Peak Flow --  Pain Score 05/22/18 1425 0     Pain Loc --      Pain Edu? --      Excl. in [place]? --    Constitutional: Alert and oriented. Well appearing and in no distress. Eyes: Conjunctivae are normal. Normal extraocular movements. ENT      Head: Normocephalic and atraumatic.      Nose: No congestion/rhinnorhea.      Mouth/Throat: Mucous membranes are moist.      Neck: No stridor. Cardiovascular: Normal rate, regular  rhythm. No murmurs, rubs, or gallops. Respiratory: Normal respiratory effort without tachypnea nor retractions. Breath sounds are clear and equal bilaterally. No wheezes/rales/rhonchi. Gastrointestinal: Soft and nontender. Normal bowel sounds Musculoskeletal: Nontender with normal range of motion in extremities. No lower extremity tenderness nor edema. Neurologic:  Normal speech and language. No gross focal neurologic deficits are appreciated.  Skin:  Skin is warm, dry and intact. No rash noted. Psychiatric: Mood and affect are normal. Speech and behavior are normal.  ____________________________________________  EKG: Interpreted by me.  Sinus bradycardia with a rate of 58 bpm, normal PR interval, normal QRS, normal QT  ____________________________________________  ED COURSE:  As part of my medical decision making, I reviewed the following data within the [place] History obtained from family if available, nursing notes, old chart and ekg, as well as notes from prior ED visits. Patient presented for chest pressure, we will assess with labs and imaging as indicated at this time.   Procedures  Garrett Watkins was evaluated in Emergency Department on 05/22/2018 for the symptoms described in the history of present illness. He was evaluated in the context of the global COVID-19 pandemic, which necessitated consideration that the patient might be at risk for infection with the SARS-CoV-2 virus that causes COVID-19. Institutional protocols and algorithms that pertain to the evaluation of patients at risk for COVID-19 are in a state of rapid change based on information released by regulatory bodies including the CDC and federal and state organizations. These policies and algorithms were followed during the patient's care in the ED.  ____________________________________________   LABS (pertinent positives/negatives)  Labs Reviewed  CBC - Abnormal; Notable for the following components:       Result Value   Hemoglobin 12.3 (*)    HCT 38.4 (*)    All other components within normal limits  BASIC METABOLIC PANEL  TROPONIN I    RADIOLOGY  Chest x-ray is normal  ____________________________________________   DIFFERENTIAL DIAGNOSIS   Musculoskeletal pain, anxiety, GERD, unstable angina  FINAL ASSESSMENT AND PLAN  Chest pain   Plan: The patient had presented for nonspecific chest pressure. Patient's labs were reassuring. Patient's imaging was also reassuring.  Likely GERD related as well as some anxiety.  He had a negative cardiac cath 2 years ago.  Have advised close monitoring of his blood pressure but otherwise he is cleared for outpatient follow-up.   Laurence Aly, MD    Note: This note was generated in part or whole with voice recognition software. Voice recognition is usually quite accurate but there are transcription errors that can and very often do occur. I apologize for any typographical errors that were not detected and corrected.     Earleen Newport, MD 05/22/18 [phone]

## 2018-05-22 NOTE — ED Notes (Signed)
Megan RN discussed with Dr. Quentin Cornwall concerns of left mouth numbness starting approx. 1 -1.5 hours ago. Per Dr. Quentin Cornwall no new orders. Facial symmetry intact, grip strength equal bilaterally. No slurred speech, A&O x 4. Pt. Complains of numbness specifically to left corner of mouth.

## 2018-05-30 DIAGNOSIS — K219 Gastro-esophageal reflux disease without esophagitis: Secondary | ICD-10-CM | POA: Diagnosis not present

## 2018-05-30 DIAGNOSIS — R1310 Dysphagia, unspecified: Secondary | ICD-10-CM | POA: Diagnosis not present

## 2018-05-30 DIAGNOSIS — R072 Precordial pain: Secondary | ICD-10-CM | POA: Diagnosis not present

## 2018-05-30 DIAGNOSIS — Z9889 Other specified postprocedural states: Secondary | ICD-10-CM | POA: Diagnosis not present

## 2018-07-13 DIAGNOSIS — Z1159 Encounter for screening for other viral diseases: Secondary | ICD-10-CM | POA: Diagnosis not present

## 2018-07-22 DIAGNOSIS — R1310 Dysphagia, unspecified: Secondary | ICD-10-CM | POA: Diagnosis not present

## 2018-07-22 DIAGNOSIS — K449 Diaphragmatic hernia without obstruction or gangrene: Secondary | ICD-10-CM | POA: Diagnosis not present

## 2018-07-22 DIAGNOSIS — I10 Essential (primary) hypertension: Secondary | ICD-10-CM | POA: Diagnosis not present

## 2018-07-22 DIAGNOSIS — K222 Esophageal obstruction: Secondary | ICD-10-CM | POA: Diagnosis not present

## 2018-09-26 ENCOUNTER — Ambulatory Visit: Payer: BLUE CROSS/BLUE SHIELD | Admitting: Endocrinology

## 2018-09-28 ENCOUNTER — Other Ambulatory Visit: Payer: Self-pay | Admitting: Urology

## 2018-11-24 ENCOUNTER — Ambulatory Visit: Payer: BLUE CROSS/BLUE SHIELD | Admitting: Endocrinology

## 2018-12-02 ENCOUNTER — Other Ambulatory Visit: Payer: Self-pay | Admitting: Endocrinology

## 2018-12-03 NOTE — Telephone Encounter (Signed)
Please forward refill request to pt's primary care provider.   

## 2018-12-21 DIAGNOSIS — N5231 Erectile dysfunction following radical prostatectomy: Secondary | ICD-10-CM | POA: Diagnosis not present

## 2018-12-22 DIAGNOSIS — E785 Hyperlipidemia, unspecified: Secondary | ICD-10-CM | POA: Diagnosis not present

## 2018-12-22 DIAGNOSIS — I10 Essential (primary) hypertension: Secondary | ICD-10-CM | POA: Diagnosis not present

## 2019-01-10 NOTE — Progress Notes (Signed)
01/11/2019  3:19 PM   Garrett Watkins 17-Aug-1960 [id number]  Referring provider: Kirk Ruths, MD [place] [facility] [place],  [place] [address]  Chief Complaint  Patient presents with  . Follow-up    HPI: 58 yo male with moderate risk prostate cancer, SUI and ED who presents today for complaints of penile curvature.    Moderate RiskProstate cancer History of s/p robotic prostatectomy 04/2016 for [id number] Gleason 4+3=7 adenocarcinoma with NEGATIVE margins. Pre-op PSA 4.1. Post-op Course: 07/2016 - PSA < 0.1 01/2017 - PSA < 0.1 07/2017 - PSA < 0.1 02/2018 - PSA < 0.1  SUI (stress urinary incontinence), male History of s/p prostatectomy 04/2016.At 9 mos down to 0-1 pads.   He reports his urinary incontinence is under control and only worsens when he drinks sodas. He reports of drinking coffee and sweet tea and regularly doing Kegal exercises. He has seen improvement in urinary symptoms and is not currently using pads. He is pleased with his improvement in urinary symptoms.   Erectile dysfunction after radical prostatectomy History of s/p prostatectomy 04/2016.At 3 and 9 mos getting some fullness with stimulation but not adequate to penetrate, no nitrates.  No significant cardiac history except for hypertension.  No history of MI. Able to walk up a flight of stairs without difficulty.  He was  started on sildenafil which at maximum doses had no effect.  He also tried Cialis but this was cost prohibitive. He is not comfortable with the intracavernousal injections.    He is currently injecting 20 mg Edex and stated that it is effective however unable to tolerate pain. He denies curvature or pain with erection. He denies any plaques.   PMH: Past Medical History:  Diagnosis Date  . BPH (benign prostatic hyperplasia)   . Colon adenoma   . Dyslipidemia   . GERD (gastroesophageal reflux disease)    history of  . Hypertension   . OSA (obstructive sleep apnea)    . Prostate cancer [facility])     Surgical History: Past Surgical History:  Procedure Laterality Date  . CARDIAC CATHETERIZATION Right 01/24/2016   Procedure: Left Heart Cath and Coronary Angiography;  Surgeon: Corey Skains, MD;  Location: [place] CV LAB;  Service: Cardiovascular;  Laterality: Right;  . CYST EXCISION    . LYMPHADENECTOMY Bilateral 04/21/2016   Procedure: PELVIC LYMPHADENECTOMY;  Surgeon: Alexis Frock, MD;  Location: [facility] ORS;  Service: Urology;  Laterality: Bilateral;  . NO PAST SURGERIES    . ROBOT ASSISTED LAPAROSCOPIC RADICAL PROSTATECTOMY N/A 04/21/2016   Procedure: ROBOTIC ASSISTED LAPAROSCOPIC RADICAL PROSTATECTOMY;  Surgeon: Alexis Frock, MD;  Location: [facility] ORS;  Service: Urology;  Laterality: N/A;  . VASECTOMY      Home Medications:  Allergies as of 01/11/2019      Reactions   Amlodipine Other (See Comments)   Swelling in ankles (edema)---with higher doses per patient.      Medication List       Accurate as of January 11, 2019  3:19 PM. If you have any questions, ask your nurse or doctor.        alprostadil 1000 MCG pellet Commonly known as: Muse 1 each (1,000 mcg total) by Transurethral route as needed for erectile dysfunction. use no more than 3 times per week   eplerenone 50 MG tablet Commonly known as: INSPRA TAKE 1 TABLET BY MOUTH TWICE A DAY   fluticasone 50 MCG/ACT nasal spray Commonly known as: FLONASE SPRAY 2 SPRAYS INTO  EACH NOSTRIL EVERY DAY   hydrALAZINE 25 MG tablet Commonly known as: APRESOLINE TK 1 T PO TID   hydrochlorothiazide 25 MG tablet Commonly known as: HYDRODIURIL Take 1 tablet (25 mg total) by mouth 3 (three) times daily.   ibuprofen 200 MG tablet Commonly known as: ADVIL Take 200 mg by mouth every 6 (six) hours as needed.   multivitamin with minerals Tabs tablet Take 1 tablet by mouth daily.   NONFORMULARY OR COMPOUNDED ITEM Trimix (30/1/10)-(Pap/Phent/PGE) Dosage: Inject 3 cc per injection 3cc  size vial Qty 1 Refills 3 [place] [phone] Fax [phone]   sildenafil 20 MG tablet Commonly known as: REVATIO TAKE 1-5 TABLETS BY MOUTH EVERY DAY AS NEEDED   tadalafil 20 MG tablet Commonly known as: CIALIS TAKE ONE TABLET BY MOUTH EVERY DAY AS NEEDED FOR ERECTILE DYSFUNCTION   telmisartan 40 MG tablet Commonly known as: Micardis Take 1 tablet (40 mg total) by mouth daily.   TONALIN SAFFLOWER OIL CLA PO Take 800 mg by mouth 2 (two) times daily.       Allergies:  Allergies  Allergen Reactions  . Amlodipine Other (See Comments)    Swelling in ankles (edema)---with higher doses per patient.    Family History: Family History  Problem Relation Age of Onset  . CAD Mother   . Prostate cancer Neg Hx   . Adrenal disorder Neg Hx     Social History:  reports that he has never smoked. He has never used smokeless tobacco. He reports that he does not drink alcohol or use drugs.  ROS: UROLOGY Frequent Urination?: No Hard to postpone urination?: No Burning/pain with urination?: No Get up at night to urinate?: No Leakage of urine?: No Urine stream starts and stops?: No Trouble starting stream?: No Do you have to strain to urinate?: No Blood in urine?: No Urinary tract infection?: No Sexually transmitted disease?: No Injury to kidneys or bladder?: No Painful intercourse?: No Weak stream?: No Erection problems?: No Penile pain?: No  Gastrointestinal Nausea?: No Vomiting?: No Indigestion/heartburn?: No Diarrhea?: No Constipation?: No  Constitutional Fever: No Night sweats?: No Weight loss?: No Fatigue?: No  Skin Skin rash/lesions?: No Itching?: No  Eyes Blurred vision?: No Double vision?: No  Ears/Nose/Throat Sore throat?: No Sinus problems?: No  Hematologic/Lymphatic Swollen glands?: No Easy bruising?: No  Cardiovascular Leg swelling?: No Chest pain?: No  Respiratory Cough?: No Shortness of breath?:  No  Endocrine Excessive thirst?: No  Musculoskeletal Back pain?: No Joint pain?: No  Neurological Headaches?: No Dizziness?: No  Psychologic Depression?: No Anxiety?: No  Physical Exam: BP 132/81   Pulse 88   Ht 6\' 1"  (1.854 m)   Wt 235 lb 14.4 oz (107 kg)   BMI 31.12 kg/m   Constitutional:  Well nourished. Alert and oriented, No acute distress. HEENT: [place] AT, moist mucus membranes.  Trachea midline, no masses. Cardiovascular: No clubbing, cyanosis, or edema. Respiratory: Normal respiratory effort, no increased work of breathing. GI: Abdomen is soft, non tender, non distended, no abdominal masses. Liver and spleen not palpable.  No hernias appreciated.  Stool sample for occult testing is not indicated.   GU: No CVA tenderness.  No bladder fullness or masses.  Patient with uncircumcised phallus. Foreskin easily retracted  Urethral meatus is patent.  No penile discharge. No penile lesions or rashes.  Scrotum without lesions, cysts, rashes and/or edema.  Testicles are located scrotally bilaterally. No masses are appreciated in the testicles. Left and right epididymis are normal. Rectal: Not  indicated Skin: No rashes, bruises or suspicious lesions. Lymph: No inguinal adenopathy. Neurologic: Grossly intact, no focal deficits, moving all 4 extremities. Psychiatric: Normal mood and affect.   Laboratory Data: Component     Latest Ref Rng & Units 11/27/2015 08/04/2016 02/08/2017 08/10/2017  Prostate Specific Ag, Serum     0.0 - 4.0 ng/mL 4.1 (H) <0.1 <0.1 <0.1   Component     Latest Ref Rng & Units 02/14/2018  Prostate Specific Ag, Serum     0.0 - 4.0 ng/mL <0.1   Assessment & Plan:    1. Prostate cancer [facility])- excellent prognosis with node negative, margin negative disease and PSA now undetectable. PSA undetectable, start yearly PSA check  RTC in 1 year office visit with PSA prior   2. SUI (stress urinary incontinence), male- doing well, reinforced importance of Kegel's and  time course for improvement.  3. Erectile dysfunction after radical prostatectomy Currently using MUSE with success  4. Penile curve Could not palpate a Peyronie's plaque on today's exam I will have the patient start vitamin E 300 international units twice daily and acquire vacuum erectile device to use for 10 minutes twice daily We will reassess in February or sooner if patient desires  Zara Council, [facility]  [place] [address] [place], [place] [address] [phone]

## 2019-01-11 ENCOUNTER — Other Ambulatory Visit: Payer: Self-pay

## 2019-01-11 ENCOUNTER — Encounter: Payer: Self-pay | Admitting: Urology

## 2019-01-11 ENCOUNTER — Ambulatory Visit: Payer: BC Managed Care – PPO | Admitting: Urology

## 2019-01-11 VITALS — BP 132/81 | HR 88 | Ht 73.0 in | Wt 235.9 lb

## 2019-01-11 DIAGNOSIS — N393 Stress incontinence (female) (male): Secondary | ICD-10-CM

## 2019-01-11 DIAGNOSIS — C61 Malignant neoplasm of prostate: Secondary | ICD-10-CM

## 2019-01-11 DIAGNOSIS — Q5561 Curvature of penis (lateral): Secondary | ICD-10-CM | POA: Diagnosis not present

## 2019-01-11 DIAGNOSIS — N5231 Erectile dysfunction following radical prostatectomy: Secondary | ICD-10-CM

## 2019-01-11 NOTE — Patient Instructions (Signed)
Vitamin e  300 IU twice daily

## 2019-01-12 NOTE — Telephone Encounter (Signed)
Pt returned your call.  

## 2019-01-16 ENCOUNTER — Other Ambulatory Visit: Payer: Self-pay | Admitting: Urology

## 2019-01-16 NOTE — Progress Notes (Signed)
Spoke with Garrett Watkins today and he has ordered the vacuum erectaid device and will call if he has questions or if he feels he needs to be seen prior to February.

## 2019-02-20 ENCOUNTER — Other Ambulatory Visit: Payer: BC Managed Care – PPO

## 2019-02-20 ENCOUNTER — Other Ambulatory Visit: Payer: Self-pay

## 2019-02-20 DIAGNOSIS — C61 Malignant neoplasm of prostate: Secondary | ICD-10-CM

## 2019-02-21 LAB — PSA: Prostate Specific Ag, Serum: <0.1 ng/mL (ref 0.0–4.0)

## 2019-02-22 ENCOUNTER — Other Ambulatory Visit: Payer: Self-pay

## 2019-02-22 ENCOUNTER — Encounter: Payer: Self-pay | Admitting: Urology

## 2019-02-22 ENCOUNTER — Ambulatory Visit: Payer: BC Managed Care – PPO | Admitting: Urology

## 2019-02-22 VITALS — BP 135/85 | HR 80 | Ht 73.0 in | Wt 234.2 lb

## 2019-02-22 DIAGNOSIS — N5231 Erectile dysfunction following radical prostatectomy: Secondary | ICD-10-CM

## 2019-02-22 DIAGNOSIS — C61 Malignant neoplasm of prostate: Secondary | ICD-10-CM

## 2019-02-22 DIAGNOSIS — Q5561 Curvature of penis (lateral): Secondary | ICD-10-CM

## 2019-02-22 DIAGNOSIS — N393 Stress incontinence (female) (male): Secondary | ICD-10-CM | POA: Diagnosis not present

## 2019-02-22 NOTE — Progress Notes (Signed)
02/22/2019  10:21 AM   Garrett Watkins 1960/06/12 [id number]  Referring provider: Kirk Ruths, MD [facility],  [place] [address]  Chief Complaint  Patient presents with  . Abnormal Penile Curvature    HPI: 59 yo male with moderate risk prostate cancer, SUI and ED and penile curvature who presents today for follow up.    Moderate RiskProstate cancer History of s/p robotic prostatectomy 04/2016 for [id number] Gleason 4+3=7 adenocarcinoma with NEGATIVE margins. Pre-op PSA 4.1. Post-op Course: 07/2016 - PSA < 0.1 01/2017 - PSA < 0.1 07/2017 - PSA < 0.1 02/2018 - PSA < 0.1 02/2019 - PSA < 0.1  SUI (stress urinary incontinence), male History of s/p prostatectomy 04/2016. Improving.   Erectile dysfunction after radical prostatectomy History of s/p prostatectomy 04/2016.At 3 and 9 mos getting some fullness with stimulation but not adequate to penetrate, no nitrates.  No significant cardiac history except for hypertension. No history of MI. Able to walk up a flight of stairs without difficulty.  He was  started on sildenafil which at maximum doses had no effect.  He also tried Cialis but this was cost prohibitive. He is not comfortable with the intracavernousal injections.  He is currently using MUSE with some benefit.  He is also using the vacuum erect aid device.   He is still experiencing the penile curvature with him use, but when he uses the vacuum erectile device the penis is straight.  He is not completely satisfied with the MUSE or the vacuum erectile device as they are both cumbersome and take out the spontaneity of intercourse.  PMH: Past Medical History:  Diagnosis Date  . BPH (benign prostatic hyperplasia)   . Colon adenoma   . Dyslipidemia   . GERD (gastroesophageal reflux disease)    history of  . Hypertension   . OSA (obstructive sleep apnea)   . Prostate cancer [facility])     Surgical History: Past Surgical History:  Procedure  Laterality Date  . CARDIAC CATHETERIZATION Right 01/24/2016   Procedure: Left Heart Cath and Coronary Angiography;  Surgeon: Corey Skains, MD;  Location: [place] [facility];  Service: Cardiovascular;  Laterality: Right;  . CYST EXCISION    . LYMPHADENECTOMY Bilateral 04/21/2016   Procedure: PELVIC LYMPHADENECTOMY;  Surgeon: Alexis Frock, MD;  Location: [facility];  Service: Urology;  Laterality: Bilateral;  . NO PAST SURGERIES    . ROBOT ASSISTED LAPAROSCOPIC RADICAL PROSTATECTOMY N/A 04/21/2016   Procedure: ROBOTIC ASSISTED LAPAROSCOPIC RADICAL PROSTATECTOMY;  Surgeon: Alexis Frock, MD;  Location: [facility];  Service: Urology;  Laterality: N/A;  . VASECTOMY      Home Medications:  Allergies as of 02/22/2019      Reactions   Amlodipine Other (See Comments)   Swelling in ankles (edema)---with higher doses per patient.      Medication List       Accurate as of February 22, 2019 10:21 AM. If you have any questions, ask your nurse or doctor.        alprostadil 1000 MCG pellet Commonly known as: Muse 1 each (1,000 mcg total) by Transurethral route as needed for erectile dysfunction. use no more than 3 times per week   atorvastatin 80 MG tablet Commonly known as: LIPITOR TAKE 1 TABLET BY MOUTH EVERY DAY   eplerenone 50 MG tablet Commonly known as: INSPRA TAKE 1 TABLET BY MOUTH TWICE A DAY   fluticasone 50 MCG/ACT nasal spray Commonly known as: FLONASE SPRAY 2  SPRAYS INTO EACH NOSTRIL EVERY DAY   hydrALAZINE 25 MG tablet Commonly known as: APRESOLINE TK 1 T PO TID   hydrochlorothiazide 25 MG tablet Commonly known as: HYDRODIURIL Take 1 tablet (25 mg total) by mouth 3 (three) times daily.   ibuprofen 200 MG tablet Commonly known as: ADVIL Take 200 mg by mouth every 6 (six) hours as needed.   multivitamin with minerals Tabs tablet Take 1 tablet by mouth daily.   NONFORMULARY OR COMPOUNDED ITEM Trimix (30/1/10)-(Pap/Phent/PGE) Dosage: Inject 3 cc per injection 3cc  size vial Qty 1 Refills 3 [place] [phone] Fax [phone]   sildenafil 20 MG tablet Commonly known as: REVATIO TAKE 1-5 TABLETS BY MOUTH EVERY DAY AS NEEDED   tadalafil 20 MG tablet Commonly known as: CIALIS TAKE ONE TABLET BY MOUTH EVERY DAY AS NEEDED FOR ERECTILE DYSFUNCTION   telmisartan 40 MG tablet Commonly known as: Micardis Take 1 tablet (40 mg total) by mouth daily.   TONALIN SAFFLOWER OIL CLA PO Take 800 mg by mouth 2 (two) times daily.       Allergies:  Allergies  Allergen Reactions  . Amlodipine Other (See Comments)    Swelling in ankles (edema)---with higher doses per patient.    Family History: Family History  Problem Relation Age of Onset  . CAD Mother   . Prostate cancer Neg Hx   . Adrenal disorder Neg Hx     Social History:  reports that he has never smoked. He has never used smokeless tobacco. He reports that he does not drink alcohol or use drugs.  ROS: UROLOGY Frequent Urination?: No Hard to postpone urination?: No Burning/pain with urination?: No Get up at night to urinate?: No Leakage of urine?: No Urine stream starts and stops?: No Trouble starting stream?: No Do you have to strain to urinate?: No Blood in urine?: No Urinary tract infection?: No Sexually transmitted disease?: No Injury to kidneys or bladder?: No Painful intercourse?: No Weak stream?: No Erection problems?: Yes Penile pain?: No  Gastrointestinal Nausea?: No Vomiting?: No Indigestion/heartburn?: No Diarrhea?: No Constipation?: No  Constitutional Fever: No Night sweats?: No Weight loss?: No Fatigue?: No  Skin Skin rash/lesions?: No Itching?: No  Eyes Blurred vision?: No Double vision?: No  Ears/Nose/Throat Sore throat?: No Sinus problems?: No  Hematologic/Lymphatic Swollen glands?: No Easy bruising?: No  Cardiovascular Leg swelling?: No Chest pain?: No  Respiratory Cough?: No Shortness of breath?:  No  Endocrine Excessive thirst?: No  Musculoskeletal Back pain?: No Joint pain?: No  Neurological Headaches?: No Dizziness?: No  Psychologic Depression?: No Anxiety?: No  Physical Exam: BP 135/85   Pulse 80   Ht 6\' 1"  (1.854 m)   Wt 234 lb 3.2 oz (106.2 kg)   BMI 30.90 kg/m   Constitutional:  Well nourished. Alert and oriented, No acute distress. HEENT: [place] AT, mask in place.  Trachea midline, no masses. Cardiovascular: No clubbing, cyanosis, or edema. Respiratory: Normal respiratory effort, no increased work of breathing. Neurologic: Grossly intact, no focal deficits, moving all 4 extremities. Psychiatric: Normal mood and affect.   Laboratory Data: Component     Latest Ref Rng & Units 11/27/2015 08/04/2016 02/08/2017 08/10/2017  Prostate Specific Ag, Serum     0.0 - 4.0 ng/mL 4.1 (H) <0.1 <0.1 <0.1   Component     Latest Ref Rng & Units 02/14/2018  Prostate Specific Ag, Serum     0.0 - 4.0 ng/mL <0.1   Assessment & Plan:    1. Prostate cancer ([place])-  excellent prognosis with node negative, margin negative disease and PSA now undetectable. PSA undetectable RTC in 1 year office visit with PSA prior   2. SUI (stress urinary incontinence), male- doing well, reinforced importance of Kegel's and time course for improvement.  3. Erectile dysfunction after radical prostatectomy Currently using MUSE and vacuum erect aid device, not completely satisfied I will see if I can acquire samples of the Caverject Impulse so Mr. Glenney can see if injecting with an auto injector would be more doable    4. Penile curve No curve seen with the vacuum erect aid device, but curve is present when he uses the MUSE Curve is likely due to under perfused parts of the corpus cavernosum rather than a Peyronie's plaque See ED   Zara Council, [address] [place], [place] [address] [phone]

## 2019-03-18 ENCOUNTER — Other Ambulatory Visit: Payer: Self-pay | Admitting: Endocrinology

## 2019-03-18 NOTE — Telephone Encounter (Signed)
1.  Please schedule f/u appt 2.  Then please refill x 1, pending that appt.  

## 2019-03-24 ENCOUNTER — Ambulatory Visit: Payer: BC Managed Care – PPO | Attending: Internal Medicine

## 2019-03-24 DIAGNOSIS — Z23 Encounter for immunization: Secondary | ICD-10-CM

## 2019-03-24 NOTE — Progress Notes (Signed)
   Covid-19 Vaccination Clinic  Name:  Garrett Watkins    MRN: [id number] DOB: 13-Feb-1960  03/24/2019  Garrett Watkins was observed post Covid-19 immunization for 15 minutes without incident. He was provided with Vaccine Information Sheet and instruction to access the V-Safe system.   Garrett Watkins was instructed to call 911 with any severe reactions post vaccine: Marland Kitchen Difficulty breathing  . Swelling of face and throat  . A fast heartbeat  . A bad rash all over body  . Dizziness and weakness   Immunizations Administered    Name Date Dose VIS Date Route   Moderna COVID-19 Vaccine 03/24/2019 12:43 PM 0.5 mL 12/13/2018 Intramuscular   Manufacturer: Moderna   Lot: [id number]   [place]:[id number]

## 2019-04-22 ENCOUNTER — Ambulatory Visit: Payer: BC Managed Care – PPO | Attending: Internal Medicine

## 2019-04-22 DIAGNOSIS — Z23 Encounter for immunization: Secondary | ICD-10-CM

## 2019-04-22 NOTE — Progress Notes (Signed)
   Covid-19 Vaccination Clinic  Name:  Garrett Watkins    MRN: [id number] DOB: 1960/06/22  04/22/2019  Mr. Seat was observed post Covid-19 immunization for 15 minutes without incident. He was provided with Vaccine Information Sheet and instruction to access the V-Safe system.   [name] was instructed to call 911 with any severe reactions post vaccine: Marland Kitchen Difficulty breathing  . Swelling of face and throat  . A fast heartbeat  . A bad rash all over body  . Dizziness and weakness   Immunizations Administered    Name Date Dose VIS Date Route   Moderna COVID-19 Vaccine 04/22/2019 10:09 AM 0.5 mL 12/13/2018 Intramuscular   Manufacturer: Moderna   LotFP:[id number]   [place]:[id number]

## 2019-04-25 ENCOUNTER — Ambulatory Visit: Payer: BC Managed Care – PPO

## 2019-05-11 ENCOUNTER — Other Ambulatory Visit: Payer: Self-pay | Admitting: Urology

## 2019-05-27 ENCOUNTER — Other Ambulatory Visit: Payer: Self-pay | Admitting: Endocrinology

## 2019-05-27 ENCOUNTER — Telehealth: Payer: Self-pay | Admitting: Endocrinology

## 2019-05-27 DIAGNOSIS — R7989 Other specified abnormal findings of blood chemistry: Secondary | ICD-10-CM

## 2019-05-27 NOTE — Telephone Encounter (Signed)
1.  Please schedule f/u appt 2.  Then please refill x 1, pending that appt.  

## 2019-05-27 NOTE — Telephone Encounter (Signed)
Correction of rx refill note: Please forward refill request to pt's primary care provider.

## 2019-05-29 NOTE — Telephone Encounter (Signed)
Telmisartan 40 MG TAKE 1 TABLET BY MOUTH EVERY DAY  Called pt to inform about Dr. Cordelia Pen response re: refill request. Unable to locate PCP in Epic to forward refill request, so Rx has been denied. Pt will need to call his PCP directly for refill of this medication. LVM requesting returned call.

## 2019-05-29 NOTE — Telephone Encounter (Signed)
Called pt and informed about medication refill denial. Advised he will need to contact his PCP for refills. Verbalized acceptance and understanding.

## 2019-05-29 NOTE — Telephone Encounter (Signed)
SECOND ATTEMPT: ° °LVM requesting returned call. °

## 2019-06-18 ENCOUNTER — Other Ambulatory Visit: Payer: Self-pay | Admitting: Endocrinology

## 2019-06-19 ENCOUNTER — Other Ambulatory Visit: Payer: Self-pay | Admitting: Endocrinology

## 2019-06-21 ENCOUNTER — Other Ambulatory Visit: Payer: Self-pay | Admitting: Endocrinology

## 2019-06-22 ENCOUNTER — Telehealth: Payer: Self-pay | Admitting: Endocrinology

## 2019-06-22 NOTE — Telephone Encounter (Signed)
LOV 03/21/18. Please advise

## 2019-06-22 NOTE — Telephone Encounter (Signed)
1.  Please schedule f/u appt 2.  Then please refill x 3 mos, pending that appt.  

## 2019-06-22 NOTE — Telephone Encounter (Signed)
Medication Refill Request  Did you call your pharmacy and request this refill first? YES  . If patient has not contacted pharmacy first, instruct them to do so for future refills.  . Remind them that contacting the pharmacy for their refill is the quickest method to get the refill.  . Refill policy also stated that it will take anywhere between 24-72 hours to receive the refill.    Name of medication? Eplerenone 50 MG - patient stated he does not know if he is supposed to continue taking, but if he is he is in need of a refill  Is this a 90 day supply? Unknown  [facility] [place]

## 2019-06-23 ENCOUNTER — Other Ambulatory Visit: Payer: Self-pay

## 2019-06-23 DIAGNOSIS — R7989 Other specified abnormal findings of blood chemistry: Secondary | ICD-10-CM

## 2019-06-23 MED ORDER — EPLERENONE 50 MG PO TABS: 50.0000 mg | ORAL_TABLET | Freq: Two times a day (BID) | ORAL | 0 refills | Status: DC

## 2019-06-23 NOTE — Telephone Encounter (Signed)
Patient returned call and scheduled follow up for Thursday August 10, 2019 @ 8:00 AM

## 2019-06-23 NOTE — Telephone Encounter (Signed)
Outpatient Medication Detail   Disp Refills Start End   eplerenone (INSPRA) 50 MG tablet 180 tablet 0 06/23/2019    Sig - Route: Take 1 tablet (50 mg total) by mouth 2 (two) times daily. - Oral   Sent to pharmacy as: eplerenone (INSPRA) 50 MG tablet   E-Prescribing Status: Receipt confirmed by pharmacy (06/23/2019  1:10 PM EDT)

## 2019-06-23 NOTE — Telephone Encounter (Signed)
Called pt to inform about Dr. Cordelia Pen response below and to schedule appt. LVM requesting returned call.

## 2019-07-11 ENCOUNTER — Other Ambulatory Visit: Payer: Self-pay | Admitting: Endocrinology

## 2019-07-11 DIAGNOSIS — R7989 Other specified abnormal findings of blood chemistry: Secondary | ICD-10-CM

## 2019-07-14 ENCOUNTER — Telehealth: Payer: Self-pay | Admitting: Endocrinology

## 2019-07-14 ENCOUNTER — Other Ambulatory Visit: Payer: Self-pay

## 2019-07-14 DIAGNOSIS — R7989 Other specified abnormal findings of blood chemistry: Secondary | ICD-10-CM

## 2019-07-14 MED ORDER — TELMISARTAN 40 MG PO TABS: 40.0000 mg | ORAL_TABLET | Freq: Every day | ORAL | 1 refills | Status: DC

## 2019-07-14 NOTE — Telephone Encounter (Signed)
1.  Please schedule f/u appt 2.  Then please refill x 2 mos, pending that appt.  

## 2019-07-14 NOTE — Telephone Encounter (Signed)
Medication Refill Request  Did you call your pharmacy and request this refill first? Yes  . If patient has not contacted pharmacy first, instruct them to do so for future refills.  . Remind them that contacting the pharmacy for their refill is the quickest method to get the refill.  . Refill policy also stated that it will take anywhere between 24-72 hours to receive the refill.    Name of medication? telmisartan  Is this a 90 day supply? yes  [facility] and location [facility] pharmacy[id number] [place], [place] - [address] Phone:  [phone]  Fax:  [phone]

## 2019-07-14 NOTE — Telephone Encounter (Signed)
Please advise 

## 2019-07-14 NOTE — Telephone Encounter (Signed)
Outpatient Medication Detail   Disp Refills Start End   telmisartan (MICARDIS) 40 MG tablet 30 tablet 1 07/14/2019    Sig - Route: Take 1 tablet (40 mg total) by mouth daily. - Oral   Sent to pharmacy as: telmisartan (MICARDIS) 40 MG tablet   E-Prescribing Status: Receipt confirmed by pharmacy (07/14/2019 11:54 AM EDT)

## 2019-08-07 ENCOUNTER — Other Ambulatory Visit: Payer: Self-pay | Admitting: Endocrinology

## 2019-08-07 DIAGNOSIS — R7989 Other specified abnormal findings of blood chemistry: Secondary | ICD-10-CM

## 2019-08-10 ENCOUNTER — Encounter: Payer: Self-pay | Admitting: Endocrinology

## 2019-08-10 ENCOUNTER — Ambulatory Visit (INDEPENDENT_AMBULATORY_CARE_PROVIDER_SITE_OTHER): Payer: BC Managed Care – PPO | Admitting: Endocrinology

## 2019-08-10 ENCOUNTER — Other Ambulatory Visit: Payer: Self-pay

## 2019-08-10 VITALS — BP 132/88 | HR 63 | Ht 73.0 in | Wt 233.4 lb

## 2019-08-10 DIAGNOSIS — I10 Essential (primary) hypertension: Secondary | ICD-10-CM

## 2019-08-10 LAB — BASIC METABOLIC PANEL
BUN: 15 mg/dL (ref 6–23)
CO2: 33 mEq/L — ABNORMAL HIGH (ref 19–32)
Calcium: 9.3 mg/dL (ref 8.4–10.5)
Chloride: 103 mEq/L (ref 96–112)
Creatinine, Ser: 1.11 mg/dL (ref 0.40–1.50)
GFR: 82.05 mL/min (ref >60.00)
Glucose, Bld: 91 mg/dL (ref 70–99)
Potassium: 3.5 mEq/L (ref 3.5–5.1)
Sodium: 139 mEq/L (ref 135–145)

## 2019-08-10 MED ORDER — HYDROCHLOROTHIAZIDE 25 MG PO TABS: 25.0000 mg | ORAL_TABLET | Freq: Every day | ORAL | 3 refills | Status: DC

## 2019-08-10 NOTE — Patient Instructions (Addendum)
Please reduce the HCTZ to 1 per day, and eplerenone to 1 pill, twice a day Please continue the same other medications.   blood tests are requested for you today.  We'll let you know about the results.  Please come back for a follow-up appointment in 1 year.

## 2019-08-10 NOTE — Progress Notes (Signed)
Subjective:    Patient ID: Garrett Watkins, male    DOB: 10-26-1960, 59 y.o.   MRN: [id number]  HPI Pt returns for f/u of elev aldo/PRA ratio (HTN was dx'ed in 2008; hypokalemia was first noted in 2018; in Dec of 2017, he was in the hospital for severe HTN; he was rxed eplerenone in Nov of 2018--he is now on max dosage; adrenals were normal on 12/18 CT).  ED: sxs since prostate surgery.    HTN: denies dizziness.  He takes eplerenone just qd, due to dizziness.   Renal insuff: Denies leg edema.   Past Medical History:  Diagnosis Date  . BPH (benign prostatic hyperplasia)   . Colon adenoma   . Dyslipidemia   . GERD (gastroesophageal reflux disease)    history of  . Hypertension   . OSA (obstructive sleep apnea)   . Prostate cancer [facility])     Past Surgical History:  Procedure Laterality Date  . CARDIAC CATHETERIZATION Right 01/24/2016   Procedure: Left Heart Cath and Coronary Angiography;  Surgeon: Corey Skains, MD;  Location: [place] [facility];  Service: Cardiovascular;  Laterality: Right;  . CYST EXCISION    . LYMPHADENECTOMY Bilateral 04/21/2016   Procedure: PELVIC LYMPHADENECTOMY;  Surgeon: Alexis Frock, MD;  Location: [facility];  Service: Urology;  Laterality: Bilateral;  . NO PAST SURGERIES    . ROBOT ASSISTED LAPAROSCOPIC RADICAL PROSTATECTOMY N/A 04/21/2016   Procedure: ROBOTIC ASSISTED LAPAROSCOPIC RADICAL PROSTATECTOMY;  Surgeon: Alexis Frock, MD;  Location: [facility];  Service: Urology;  Laterality: N/A;  . VASECTOMY      Social History   Socioeconomic History  . Marital status: Married    Spouse name: Not on file  . Number of children: Not on file  . Years of education: Not on file  . Highest education level: Not on file  Occupational History  . Not on file  Tobacco Use  . Smoking status: Never Smoker  . Smokeless tobacco: Never Used  Vaping Use  . Vaping Use: Never used  Substance and Sexual Activity  . Alcohol use: No  . Drug use: No  . Sexual  activity: Yes  Other Topics Concern  . Not on file  Social History Narrative  . Not on file   Social Determinants of Health   Financial Resource Strain:   . Difficulty of Paying Living Expenses:   Food Insecurity:   . Worried About Charity fundraiser in the Last Year:   . Arboriculturist in the Last Year:   Transportation Needs:   . Film/video editor (Medical):   Marland Kitchen Lack of Transportation (Non-Medical):   Physical Activity:   . Days of Exercise per Week:   . Minutes of Exercise per Session:   Stress:   . Feeling of Stress :   Social Connections:   . Frequency of Communication with Friends and Family:   . Frequency of Social Gatherings with Friends and Family:   . Attends Religious Services:   . Active Member of Clubs or Organizations:   . Attends Archivist Meetings:   Marland Kitchen Marital Status:   Intimate Partner Violence:   . Fear of Current or Ex-Partner:   . Emotionally Abused:   Marland Kitchen Physically Abused:   . Sexually Abused:     Current Outpatient Medications on File Prior to Visit  Medication Sig Dispense Refill  . atorvastatin (LIPITOR) 80 MG tablet TAKE 1 TABLET BY MOUTH EVERY DAY    . eplerenone (  INSPRA) 50 MG tablet Take 1 tablet (50 mg total) by mouth 2 (two) times daily. 180 tablet 0  . fluticasone (FLONASE) 50 MCG/ACT nasal spray SPRAY 2 SPRAYS INTO EACH NOSTRIL EVERY DAY    . hydrALAZINE (APRESOLINE) 25 MG tablet TK 1 T PO TID  6  . ibuprofen (ADVIL,MOTRIN) 200 MG tablet Take 200 mg by mouth every 6 (six) hours as needed.    . Multiple Vitamin (MULTIVITAMIN WITH MINERALS) TABS tablet Take 1 tablet by mouth daily.    . MUSE 1000 MCG pellet INSERT 1 SUPPOSITORYIN URETHRA AS NEEDED FOR ERCTILE DYSFUNCTION USE NO MORE THAN 3 TIMES A WEEK 6 each 8  . NONFORMULARY OR COMPOUNDED ITEM Trimix (30/1/10)-(Pap/Phent/PGE) Dosage: Inject 3 cc per injection 3cc size vial Qty 1 Refills 3 [place] [phone] Fax [phone] 1 each 0  . sildenafil  (REVATIO) 20 MG tablet TAKE 1-5 TABLETS BY MOUTH EVERY DAY AS NEEDED 60 tablet 6  . tadalafil (CIALIS) 20 MG tablet TAKE ONE TABLET BY MOUTH EVERY DAY AS NEEDED FOR ERECTILE DYSFUNCTION 6 tablet 11  . telmisartan (MICARDIS) 40 MG tablet TAKE 1 TABLET BY MOUTH EVERY DAY 30 tablet 0  . TONALIN SAFFLOWER OIL CLA PO Take 800 mg by mouth 2 (two) times daily.     No current facility-administered medications on file prior to visit.    Allergies  Allergen Reactions  . Amlodipine Other (See Comments)    Swelling in ankles (edema)---with higher doses per patient.    Family History  Problem Relation Age of Onset  . CAD Mother   . Prostate cancer Neg Hx   . Adrenal disorder Neg Hx     BP (!) 132/88   Pulse 63   Ht 6\' 1"  (1.854 m)   Wt (!) 233 lb 6.4 oz (105.9 kg)   SpO2 98%   BMI 30.79 kg/m    Review of Systems     Objective:   Physical Exam VITAL SIGNS:  See vs page GENERAL: no distress.   CV: no leg edema.        Assessment & Plan:  elev aldo/PRA ratio. he needs some adjustment in his therapy.  Dizziness, due to eplerenone.  Renal insuff: recheck today.   Patient Instructions  Please reduce the HCTZ to 1 per day, and eplerenone to 1 pill, twice a day Please continue the same other medications.   blood tests are requested for you today.  We'll let you know about the results.  Please come back for a follow-up appointment in 1 year.

## 2019-08-11 ENCOUNTER — Encounter: Payer: Self-pay | Admitting: Endocrinology

## 2019-08-14 ENCOUNTER — Other Ambulatory Visit: Payer: Self-pay | Admitting: Endocrinology

## 2019-08-14 DIAGNOSIS — I10 Essential (primary) hypertension: Secondary | ICD-10-CM

## 2019-08-22 ENCOUNTER — Other Ambulatory Visit (HOSPITAL_COMMUNITY): Payer: Self-pay | Admitting: Unknown Physician Specialty

## 2019-08-22 ENCOUNTER — Other Ambulatory Visit: Payer: Self-pay | Admitting: Unknown Physician Specialty

## 2019-08-22 DIAGNOSIS — M8558 Aneurysmal bone cyst, other site: Secondary | ICD-10-CM

## 2019-08-30 ENCOUNTER — Ambulatory Visit
Admission: RE | Admit: 2019-08-30 | Discharge: 2019-08-30 | Disposition: A | Discharge status: Home / Self Care | Payer: BC Managed Care – PPO | Source: Ambulatory Visit | Attending: Unknown Physician Specialty | Admitting: Unknown Physician Specialty

## 2019-08-30 ENCOUNTER — Other Ambulatory Visit: Payer: Self-pay

## 2019-08-30 DIAGNOSIS — M8568 Other cyst of bone, other site: Secondary | ICD-10-CM | POA: Diagnosis not present

## 2019-08-30 DIAGNOSIS — M8558 Aneurysmal bone cyst, other site: Secondary | ICD-10-CM | POA: Insufficient documentation

## 2019-08-30 DIAGNOSIS — J3489 Other specified disorders of nose and nasal sinuses: Secondary | ICD-10-CM | POA: Diagnosis not present

## 2019-08-30 MED ORDER — IOHEXOL 300 MG/ML  SOLN
75.0000 mL | Freq: Once | INTRAMUSCULAR | Status: AC | PRN
  Administered 2019-08-30: 75 mL via INTRAVENOUS

## 2019-09-01 DIAGNOSIS — M8550 Aneurysmal bone cyst, unspecified site: Secondary | ICD-10-CM | POA: Diagnosis not present

## 2019-09-01 DIAGNOSIS — J3489 Other specified disorders of nose and nasal sinuses: Secondary | ICD-10-CM | POA: Diagnosis not present

## 2019-09-05 ENCOUNTER — Other Ambulatory Visit: Payer: Self-pay | Admitting: Endocrinology

## 2019-09-05 DIAGNOSIS — R7989 Other specified abnormal findings of blood chemistry: Secondary | ICD-10-CM

## 2019-09-13 ENCOUNTER — Other Ambulatory Visit: Payer: Self-pay | Admitting: Endocrinology

## 2019-09-13 DIAGNOSIS — R7989 Other specified abnormal findings of blood chemistry: Secondary | ICD-10-CM

## 2019-09-21 DIAGNOSIS — M279 Disease of jaws, unspecified: Secondary | ICD-10-CM | POA: Diagnosis not present

## 2019-09-27 DIAGNOSIS — K091 Developmental (nonodontogenic) cysts of oral region: Secondary | ICD-10-CM | POA: Diagnosis not present

## 2019-10-03 DIAGNOSIS — Z0000 Encounter for general adult medical examination without abnormal findings: Secondary | ICD-10-CM | POA: Diagnosis not present

## 2019-10-03 DIAGNOSIS — I10 Essential (primary) hypertension: Secondary | ICD-10-CM | POA: Diagnosis not present

## 2019-10-03 DIAGNOSIS — G4733 Obstructive sleep apnea (adult) (pediatric): Secondary | ICD-10-CM | POA: Diagnosis not present

## 2019-10-03 DIAGNOSIS — E785 Hyperlipidemia, unspecified: Secondary | ICD-10-CM | POA: Diagnosis not present

## 2019-10-06 ENCOUNTER — Other Ambulatory Visit: Payer: Self-pay | Admitting: Endocrinology

## 2019-10-06 DIAGNOSIS — R7989 Other specified abnormal findings of blood chemistry: Secondary | ICD-10-CM

## 2019-10-06 DIAGNOSIS — I10 Essential (primary) hypertension: Secondary | ICD-10-CM | POA: Diagnosis not present

## 2019-10-06 DIAGNOSIS — E785 Hyperlipidemia, unspecified: Secondary | ICD-10-CM | POA: Diagnosis not present

## 2019-10-09 DIAGNOSIS — M205X1 Other deformities of toe(s) (acquired), right foot: Secondary | ICD-10-CM | POA: Diagnosis not present

## 2019-10-09 DIAGNOSIS — M898X9 Other specified disorders of bone, unspecified site: Secondary | ICD-10-CM | POA: Diagnosis not present

## 2019-11-04 ENCOUNTER — Other Ambulatory Visit: Payer: Self-pay | Admitting: Endocrinology

## 2019-11-04 DIAGNOSIS — R7989 Other specified abnormal findings of blood chemistry: Secondary | ICD-10-CM

## 2019-11-28 ENCOUNTER — Other Ambulatory Visit: Payer: Self-pay | Admitting: Endocrinology

## 2019-11-28 DIAGNOSIS — R7989 Other specified abnormal findings of blood chemistry: Secondary | ICD-10-CM

## 2019-12-06 ENCOUNTER — Other Ambulatory Visit: Payer: Self-pay | Admitting: Endocrinology

## 2019-12-06 DIAGNOSIS — R7989 Other specified abnormal findings of blood chemistry: Secondary | ICD-10-CM

## 2019-12-08 ENCOUNTER — Other Ambulatory Visit: Payer: Self-pay | Admitting: Endocrinology

## 2019-12-08 DIAGNOSIS — R7989 Other specified abnormal findings of blood chemistry: Secondary | ICD-10-CM

## 2019-12-27 ENCOUNTER — Other Ambulatory Visit: Payer: Self-pay | Admitting: Urology

## 2019-12-27 NOTE — Telephone Encounter (Signed)
Would you have Garrett Watkins schedule his follow up in February for prostate cancer in February with PSA prior?  This will need to be done prior to refilling his sildenafil.

## 2019-12-29 NOTE — Telephone Encounter (Signed)
Appointment made for follow up with PSA prior in February. Patient was last seen February 2021 and asks why the sildenafil can't be refilled at this time. Please advise.

## 2020-01-08 NOTE — Telephone Encounter (Signed)
LMOM that a prescription was sent to pharmacy on file.

## 2020-01-30 ENCOUNTER — Other Ambulatory Visit: Payer: Self-pay | Admitting: Endocrinology

## 2020-01-30 DIAGNOSIS — R7989 Other specified abnormal findings of blood chemistry: Secondary | ICD-10-CM

## 2020-02-14 ENCOUNTER — Other Ambulatory Visit: Payer: Self-pay

## 2020-02-14 DIAGNOSIS — C61 Malignant neoplasm of prostate: Secondary | ICD-10-CM

## 2020-02-19 ENCOUNTER — Other Ambulatory Visit: Payer: Self-pay

## 2020-02-19 ENCOUNTER — Other Ambulatory Visit: Payer: BC Managed Care – PPO

## 2020-02-19 DIAGNOSIS — C61 Malignant neoplasm of prostate: Secondary | ICD-10-CM

## 2020-02-20 LAB — PSA: Prostate Specific Ag, Serum: <0.1 ng/mL (ref 0.0–4.0)

## 2020-02-21 ENCOUNTER — Ambulatory Visit: Payer: BC Managed Care – PPO | Admitting: Urology

## 2020-02-21 NOTE — Progress Notes (Signed)
02/22/2020  9:48 AM   Garrett Watkins 1960/05/18 [id number]  Referring provider: Kirk Ruths, MD [place] [facility] [place],  [place] [address]  Chief Complaint  Patient presents with  . Follow-up    1 year follow-up   Urological history: 1. Prostate cancer - s/p robotic prostatectomy 04/2016 for pT2N0Mx Gleason 4+3=7 adenocarcinoma with NEGATIVE margins. Pre-op PSA 4.1. - PSA <0.1 in 02/2020  2. SUI - managed conservatively  3. ED - SHIM 14 - contributing factors of prostate cancer, pelvic surgery, HLD, HTN, sleep apnea and CAD - managed with vacuum erectaid device and MUSE  HPI: Garrett Watkins is a 60 y.o. male who presents today for yearly appointment.    His stress urinary incontinence is a lot better.  He does not wear protective garments.  He states as long as he drinks water and juices he has very minimal issue.  Patient denies any modifying or aggravating factors.  Patient denies any gross hematuria, dysuria or suprapubic/flank pain.  Patient denies any fevers, chills, nausea or vomiting.   UA clear.  He is finding the MUSE less effective.  He can achieve a satisfactory erection when he uses the MUSE with 20 mg of sildenafil, but the erection lasts longer than two hours and is uncomfortable.    He is also bothered by the lack of spontaneity while using the MUSE and the vacuum erectile device.  He feels that he is having better erections over the last year.  ICI was very effective in giving satisfactory erections, but he was uncomfortable with injections and of course the lack of spontaneity.  He is also interested in revisiting the [facility] therapy.  Patient still having spontaneous erections.  He has noted a slight curvature in his penis with erections.     SHIM    Row Name 02/22/20 [phone]         SHIM: Over the last 6 months:   How do you rate your confidence that you could get and keep an erection? Moderate     When you had erections  with sexual stimulation, how often were your erections hard enough for penetration (entering your partner)? Most Times (much more than half the time)     During sexual intercourse, how often were you able to maintain your erection after you had penetrated (entered) your partner? A Few Times (much less than half the time)     During sexual intercourse, how difficult was it to maintain your erection to completion of intercourse? Very Difficult     When you attempted sexual intercourse, how often was it satisfactory for you? Sometimes (about half the time)           SHIM Total Score   SHIM 14            Score: 1-7 Severe ED 8-11 Moderate ED 12-16 Mild-Moderate ED 17-21 Mild ED 22-25 No ED     PMH: Past Medical History:  Diagnosis Date  . BPH (benign prostatic hyperplasia)   . Colon adenoma   . Dyslipidemia   . GERD (gastroesophageal reflux disease)    history of  . Hypertension   . OSA (obstructive sleep apnea)   . Prostate cancer [facility])     Surgical History: Past Surgical History:  Procedure Laterality Date  . CARDIAC CATHETERIZATION Right 01/24/2016   Procedure: Left Heart Cath and Coronary Angiography;  Surgeon: Corey Skains, MD;  Location: [facility];  Service: Cardiovascular;  Laterality: Right;  . CYST EXCISION    . LYMPHADENECTOMY Bilateral 04/21/2016   Procedure: PELVIC LYMPHADENECTOMY;  Surgeon: Alexis Frock, MD;  Location: [facility] ORS;  Service: Urology;  Laterality: Bilateral;  . NO PAST SURGERIES    . ROBOT ASSISTED LAPAROSCOPIC RADICAL PROSTATECTOMY N/A 04/21/2016   Procedure: ROBOTIC ASSISTED LAPAROSCOPIC RADICAL PROSTATECTOMY;  Surgeon: Alexis Frock, MD;  Location: [facility] ORS;  Service: Urology;  Laterality: N/A;  . VASECTOMY      Home Medications:  Allergies as of 02/22/2020      Reactions   Amlodipine Other (See Comments)   Swelling in ankles (edema)---with higher doses per patient. Other reaction(s): Other (See Comments) Swelling in  ankles (edema)---with higher doses per patient. Edema (larger amounts)       Medication List       Accurate as of February 22, 2020  9:48 AM. If you have any questions, ask your nurse or doctor.        STOP taking these medications   sildenafil 20 MG tablet Commonly known as: REVATIO Stopped by: [place] Carreno, PA-C     TAKE these medications   atorvastatin 80 MG tablet Commonly known as: LIPITOR TAKE 1 TABLET BY MOUTH EVERY DAY   eplerenone 50 MG tablet Commonly known as: INSPRA TAKE 1 TABLET BY MOUTH TWICE A DAY   fluticasone 50 MCG/ACT nasal spray Commonly known as: FLONASE SPRAY 2 SPRAYS INTO EACH NOSTRIL EVERY DAY   hydrALAZINE 25 MG tablet Commonly known as: APRESOLINE TK 1 T PO TID   hydrochlorothiazide 25 MG tablet Commonly known as: HYDRODIURIL Take 1 tablet (25 mg total) by mouth daily.   ibuprofen 200 MG tablet Commonly known as: ADVIL Take 200 mg by mouth every 6 (six) hours as needed.   multivitamin with minerals Tabs tablet Take 1 tablet by mouth daily.   Muse 1000 MCG pellet Generic drug: alprostadil INSERT 1 SUPPOSITORYIN URETHRA AS NEEDED FOR ERCTILE DYSFUNCTION USE NO MORE THAN 3 TIMES A WEEK   NONFORMULARY OR COMPOUNDED ITEM Trimix (30/1/10)-(Pap/Phent/PGE) Dosage: Inject 3 cc per injection 3cc size vial Qty 1 Refills 3 [place] [phone] Fax [phone]   tadalafil 20 MG tablet Commonly known as: Cialis Take 1 tablet (20 mg total) by mouth daily as needed for erectile dysfunction. Started by: Zara Council, PA-C   telmisartan 40 MG tablet Commonly known as: MICARDIS TAKE 1 TABLET BY MOUTH EVERY DAY   TONALIN SAFFLOWER OIL CLA PO Take 800 mg by mouth 2 (two) times daily.       Allergies:  Allergies  Allergen Reactions  . Amlodipine Other (See Comments)    Swelling in ankles (edema)---with higher doses per patient. Other reaction(s): Other (See Comments) Swelling in ankles (edema)---with higher doses  per patient. Edema (larger amounts)     Family History: Family History  Problem Relation Age of Onset  . CAD Mother   . Prostate cancer Neg Hx   . Adrenal disorder Neg Hx     Social History:  reports that he has never smoked. He has never used smokeless tobacco. He reports that he does not drink alcohol and does not use drugs.  For pertinent review of systems please refer to history of present illness  Physical Exam: BP 136/84   Pulse 68   Ht '6\' 1"'  (1.854 m)   Wt 237 lb (107.5 kg)   BMI 31.27 kg/m   Constitutional:  Well nourished. Alert and oriented, No acute distress. HEENT: [place] AT, mask in place.  Trachea  midline Cardiovascular: No clubbing, cyanosis, or edema. Respiratory: Normal respiratory effort, no increased work of breathing. GU: No CVA tenderness.  No bladder fullness or masses.  Patient with circumcised phallus.  Urethral meatus is patent.  No penile discharge. No penile lesions or rashes.  I did not appreciate a Peyronie's plaque. Neurologic: Grossly intact, no focal deficits, moving all 4 extremities. Psychiatric: Normal mood and affect.   Laboratory Data: Specimen:  Blood  Ref Range & Units 4 mo ago  Glucose 70 - 110 mg/dL 86   Sodium 136 - 145 mmol/L 140   Potassium 3.6 - 5.1 mmol/L 4.2   Chloride 97 - 109 mmol/L 102   Carbon Dioxide (CO2) 22.0 - 32.0 mmol/L 33.0High   Calcium 8.7 - 10.3 mg/dL 10.1   Urea Nitrogen (BUN) 7 - 25 mg/dL 17   Creatinine 0.7 - 1.3 mg/dL 1.3   Glomerular Filtration Rate (eGFR), MDRD Estimate >60 mL/min/1.73sq m 68   BUN/Crea Ratio 6.0 - 20.0 13.1   Anion Gap w/K 6.0 - 16.0 9.2   Resulting Agency  [facility] - LAB  Specimen Collected: 10/06/19 7:34 AM Last Resulted: 10/06/19 9:37 AM  Received From: [facility]  Result Received: 02/14/20 1:27 PM   Specimen:  Blood  Ref Range & Units 4 mo ago  Cholesterol, Total 100 - 200 mg/dL 222High   Triglyceride 35 - 199 mg/dL 95   HDL (High Density  Lipoprotein) Cholesterol 29.0 - 71.0 mg/dL 53.3   LDL Calculated 0 - 130 mg/dL 150High   VLDL Cholesterol mg/dL 19   Cholesterol/HDL Ratio  4.2   Resulting [facility] - LAB  Specimen Collected: 10/06/19 7:34 AM Last Resulted: 10/06/19 9:37 AM  Received From: [facility]  Result Received: 02/14/20 1:27 PM   Specimen:  Blood  Ref Range & Units 4 mo ago  Protein, Total 6.1 - 7.9 g/dL 7.4   Albumin 3.5 - 4.8 g/dL 4.5   Bilirubin, Total 0.3 - 1.2 mg/dL 1.0   Bilirubin, Conjugated 0.00 - 0.20 mg/dL 0.15   Alk Phos (alkaline Phosphatase) 34 - 104 U/L 85   AST  8 - 39 U/L 14   ALT  6 - 57 U/L 8   Resulting Agency  [facility] - LAB  Specimen Collected: 10/06/19 7:34 AM Last Resulted: 10/06/19 9:37 AM  Received From: [facility]  Result Received: 11/04/19 10:18 AM   Urinalysis Component     Latest Ref Rng & Units 02/22/2020  Specific Gravity, UA     1.005 - 1.030 1.025  pH, UA     5.0 - 7.5 6.0  Color, UA     Yellow Yellow  Appearance Ur     Clear Clear  Leukocytes,UA     Negative Negative  Protein,UA     Negative/Trace Negative  Glucose, UA     Negative Negative  Ketones, UA     Negative Negative  RBC, UA     Negative Negative  Bilirubin, UA     Negative Negative  Urobilinogen, Ur     0.2 - 1.0 mg/dL 1.0  Nitrite, UA     Negative Negative  Microscopic Examination      See below:   Component     Latest Ref Rng & Units 02/22/2020  WBC, UA     0 - 5 /hpf 0-5  RBC     0 - 2 /hpf 0-2  Epithelial Cells (non renal)  0 - 10 /hpf 0-10  Bacteria, UA     None seen/Few Few   I have reviewed the labs.  Assessment & Plan:    1. Prostate cancer [facility])- excellent prognosis with node negative, margin negative disease and PSA now undetectable. PSA undetectable  2. SUI (stress urinary incontinence), male- doing well, reinforced importance of Kegel's and time course for improvement.  3. Erectile  dysfunction after radical prostatectomy Currently using MUSE and vacuum erect aid device, not completely satisfied We will have a trial of Cialis 20 mg daily to see if he can have more consistent and longer lasting erections He will notify me by my chart in 30 days of the results He will also like to be reconsidered for the wave therapy as he feels his erections are continuing to improve He will contact Jiles Crocker at [facility] urological to see if he is now a candidate for the wave therapy  4. Penile curve Peyronie's plaque was not appreciated on today's exam   Patient will follow up in 1 year for PSA, SHIM and office visit  Zara Council, PA-C  [place] [address] [place], [place] [address] [phone]

## 2020-02-22 ENCOUNTER — Other Ambulatory Visit: Payer: Self-pay

## 2020-02-22 ENCOUNTER — Ambulatory Visit: Payer: BC Managed Care – PPO | Admitting: Urology

## 2020-02-22 ENCOUNTER — Encounter: Payer: Self-pay | Admitting: Urology

## 2020-02-22 VITALS — BP 136/84 | HR 68 | Ht 73.0 in | Wt 237.0 lb

## 2020-02-22 DIAGNOSIS — C61 Malignant neoplasm of prostate: Secondary | ICD-10-CM

## 2020-02-22 DIAGNOSIS — Q5561 Curvature of penis (lateral): Secondary | ICD-10-CM | POA: Diagnosis not present

## 2020-02-22 DIAGNOSIS — N393 Stress incontinence (female) (male): Secondary | ICD-10-CM

## 2020-02-22 DIAGNOSIS — N5231 Erectile dysfunction following radical prostatectomy: Secondary | ICD-10-CM | POA: Diagnosis not present

## 2020-02-22 LAB — URINALYSIS, COMPLETE
Bilirubin, UA: NEGATIVE
Glucose, UA: NEGATIVE
Ketones, UA: NEGATIVE
Leukocytes,UA: NEGATIVE
Nitrite, UA: NEGATIVE
Protein,UA: NEGATIVE
RBC, UA: NEGATIVE
Specific Gravity, UA: 1.025 (ref 1.005–1.030)
Urobilinogen, Ur: 1 mg/dL (ref 0.2–1.0)
pH, UA: 6 (ref 5.0–7.5)

## 2020-02-22 LAB — MICROSCOPIC EXAMINATION

## 2020-02-22 MED ORDER — TADALAFIL 20 MG PO TABS: 20.0000 mg | ORAL_TABLET | Freq: Every day | ORAL | 1 refills | Status: DC | PRN

## 2020-02-22 NOTE — Patient Instructions (Signed)
Jiles Crocker, AGNP-C   [phone]

## 2020-05-10 ENCOUNTER — Other Ambulatory Visit: Payer: Self-pay | Admitting: Endocrinology

## 2020-05-10 DIAGNOSIS — R7989 Other specified abnormal findings of blood chemistry: Secondary | ICD-10-CM

## 2020-05-23 ENCOUNTER — Other Ambulatory Visit: Payer: Self-pay | Admitting: *Deleted

## 2020-05-23 MED ORDER — TADALAFIL 20 MG PO TABS: 20.0000 mg | ORAL_TABLET | Freq: Every day | ORAL | 1 refills | Status: DC | PRN

## 2020-08-07 ENCOUNTER — Other Ambulatory Visit: Payer: Self-pay | Admitting: Endocrinology

## 2020-08-08 ENCOUNTER — Other Ambulatory Visit: Payer: Self-pay | Admitting: Endocrinology

## 2020-08-08 DIAGNOSIS — R7989 Other specified abnormal findings of blood chemistry: Secondary | ICD-10-CM

## 2020-08-15 ENCOUNTER — Ambulatory Visit: Payer: BC Managed Care – PPO | Admitting: Endocrinology

## 2020-08-16 ENCOUNTER — Other Ambulatory Visit: Payer: Self-pay | Admitting: Endocrinology

## 2020-08-29 ENCOUNTER — Ambulatory Visit: Payer: BC Managed Care – PPO | Admitting: Endocrinology

## 2020-09-26 ENCOUNTER — Ambulatory Visit: Payer: BC Managed Care – PPO | Admitting: Endocrinology

## 2020-10-28 ENCOUNTER — Ambulatory Visit: Payer: BC Managed Care – PPO | Admitting: Endocrinology

## 2020-11-19 ENCOUNTER — Other Ambulatory Visit: Payer: Self-pay | Admitting: Urology

## 2021-02-12 ENCOUNTER — Other Ambulatory Visit: Payer: BC Managed Care – PPO

## 2021-02-12 ENCOUNTER — Other Ambulatory Visit: Payer: Self-pay

## 2021-02-12 DIAGNOSIS — C61 Malignant neoplasm of prostate: Secondary | ICD-10-CM

## 2021-02-13 LAB — PSA: Prostate Specific Ag, Serum: <0.1 ng/mL (ref 0.0–4.0)

## 2021-02-19 NOTE — Progress Notes (Signed)
02/20/2021  9:40 AM   [name] 1960/08/01 [id number]  Referring provider: Kirk Ruths, MD [place] [facility] [place],  [place] [address]  Chief Complaint  Patient presents with   Prostate Cancer   Urological history: 1. Prostate cancer -PSA <0.1 in 02/2021 -s/p robotic prostatectomy 04/2016 for pT2N0Mx Gleason 4+3=7 adenocarcinoma with NEGATIVE margins. Pre-op PSA 4.1.  2. SUI - managed conservatively  3. ED -contributing factors of prostate cancer, pelvic surgery, HLD, HTN, sleep apnea and CAD -SHIM 19 -managed with vacuum erectaid device, MUSE and tadalafil 20 mg, on-demand-dosing  HPI: Garrett Watkins is a 61 y.o. male who presents today for yearly appointment.    His stress incontinence has improved immensely.  He has noticed that drinking tea makes it worse, so he limits his tea consumption.  Patient denies any modifying or aggravating factors.  Patient denies any gross hematuria, dysuria or suprapubic/flank pain.  Patient denies any fevers, chills, nausea or vomiting.    He is has [facility] [place] which has been somewhat successful.  He is taking the tadalafil 20 mg several days a week.   He is experiencing more spontaneous erections in the morning.  He is able to achieve an erection, but is still losing the erection with penetration without the tadalafil or the Muse.  He states the Muse works predictably and he is satisfied with that.  He denies any pain or curvature with erections.     SHIM     Row Name 02/20/21 0934         SHIM: Over the last 6 months:   How do you rate your confidence that you could get and keep an erection? Moderate     When you had erections with sexual stimulation, how often were your erections hard enough for penetration (entering your partner)? Almost Always or Always     During sexual intercourse, how often were you able to maintain your erection after you had penetrated  (entered) your partner? Most Times (much more than half the time)     During sexual intercourse, how difficult was it to maintain your erection to completion of intercourse? Slightly Difficult     When you attempted sexual intercourse, how often was it satisfactory for you? Most Times (much more than half the time)       SHIM Total Score   SHIM 20               Score: 1-7 Severe ED 8-11 Moderate ED 12-16 Mild-Moderate ED 17-21 Mild ED 22-25 No ED       PMH: Past Medical History:  Diagnosis Date   BPH (benign prostatic hyperplasia)    Colon adenoma    Dyslipidemia    GERD (gastroesophageal reflux disease)    history of   Hypertension    OSA (obstructive sleep apnea)    Prostate cancer [facility])     Surgical History: Past Surgical History:  Procedure Laterality Date   CARDIAC CATHETERIZATION Right 01/24/2016   Procedure: Left Heart Cath and Coronary Angiography;  Surgeon: Corey Skains, MD;  Location: [facility];  Service: Cardiovascular;  Laterality: Right;   CYST EXCISION     LYMPHADENECTOMY Bilateral 04/21/2016   Procedure: PELVIC LYMPHADENECTOMY;  Surgeon: Alexis Frock, MD;  Location: [facility] ORS;  Service: Urology;  Laterality: Bilateral;   NO PAST SURGERIES     ROBOT ASSISTED LAPAROSCOPIC RADICAL PROSTATECTOMY N/A 04/21/2016   Procedure:  ROBOTIC ASSISTED LAPAROSCOPIC RADICAL PROSTATECTOMY;  Surgeon: Alexis Frock, MD;  Location: [facility] ORS;  Service: Urology;  Laterality: N/A;   VASECTOMY      Home Medications:  Allergies as of 02/20/2021       Reactions   Amlodipine Other (See Comments)   Swelling in ankles (edema)---with higher doses per patient. Other reaction(s): Other (See Comments) Swelling in ankles (edema)---with higher doses per patient. Edema (larger amounts)         Medication List        Accurate as of February 20, 2021  9:40 AM. If you have any questions, ask your nurse or doctor.          amLODipine 5 MG tablet Commonly  known as: NORVASC Take 5 mg by mouth daily.   atorvastatin 80 MG tablet Commonly known as: LIPITOR TAKE 1 TABLET BY MOUTH EVERY DAY   eplerenone 50 MG tablet Commonly known as: INSPRA TAKE 1 TABLET BY MOUTH TWICE A DAY   fluticasone 50 MCG/ACT nasal spray Commonly known as: FLONASE SPRAY 2 SPRAYS INTO EACH NOSTRIL EVERY DAY   hydrALAZINE 25 MG tablet Commonly known as: APRESOLINE TK 1 T PO TID   hydrochlorothiazide 25 MG tablet Commonly known as: HYDRODIURIL TAKE 1 TABLET BY MOUTH EVERY DAY   ibuprofen 200 MG tablet Commonly known as: ADVIL Take 200 mg by mouth every 6 (six) hours as needed.   multivitamin with minerals Tabs tablet Take 1 tablet by mouth daily.   Muse 1000 MCG pellet Generic drug: alprostadil INSERT 1 SUPPOSITORYIN URETHRA AS NEEDED FOR ERCTILE DYSFUNCTION USE NO MORE THAN 3 TIMES A WEEK   NONFORMULARY OR COMPOUNDED ITEM Trimix (30/1/10)-(Pap/Phent/PGE) Dosage: Inject 3 cc per injection 3cc size vial Qty 1 Refills 3 [place] [phone] Fax [phone]   tadalafil 20 MG tablet Commonly known as: CIALIS TAKE ONE TABLET BY MOUTH ONE TIME DAILY AS NEEDED FOR ERECTILE DYSFUNCTION   telmisartan 40 MG tablet Commonly known as: MICARDIS TAKE 1 TABLET BY MOUTH EVERY DAY   TONALIN SAFFLOWER OIL CLA PO Take 800 mg by mouth 2 (two) times daily.        Allergies:  Allergies  Allergen Reactions   Amlodipine Other (See Comments)    Swelling in ankles (edema)---with higher doses per patient. Other reaction(s): Other (See Comments) Swelling in ankles (edema)---with higher doses per patient. Edema (larger amounts)     Family History: Family History  Problem Relation Age of Onset   CAD Mother    Prostate cancer Neg Hx    Adrenal disorder Neg Hx     Social History:  reports that he has never smoked. He has never used smokeless tobacco. He reports that he does not drink alcohol and does not use drugs.  For pertinent review of  systems please refer to history of present illness  Physical Exam: BP 138/86    Pulse 62    Ht 6\' 1"  (1.854 m)    Wt 239 lb (108.4 kg)    BMI 31.53 kg/m   Constitutional:  Well nourished. Alert and oriented, No acute distress. HEENT: [place] AT, mask in place.  Trachea midline Cardiovascular: No clubbing, cyanosis, or edema. Respiratory: Normal respiratory effort, no increased work of breathing. Neurologic: Grossly intact, no focal deficits, moving all 4 extremities. Psychiatric: Normal mood and affect.   Laboratory Data: Component     Latest Ref Rng & Units 02/12/2021  Prostate Specific Ag, Serum     0.0 - 4.0 ng/mL <0.1  I have reviewed the labs.  Assessment & Plan:    1. Prostate cancer ([facility]) -PSA undetectable -excellent prognosis with node negative, margin negative disease and PSA now undetectable.   2. SUI (stress urinary incontinence), male  - doing well   3. Erectile dysfunction after radical prostatectomy -Has completed 2 rounds of LiSWT and is considering a third round -He is taking tadalafil 20 mg several times weekly to maintain penile health -Muse is resulting in satisfactory intercourse -Refills given for tadalafil and Muse   Patient will follow up in 1 year for PSA, SHIM and office visit  Zara Council, PA-C  [place] [address] [place], [place] [address] [phone]

## 2021-02-20 ENCOUNTER — Encounter: Payer: Self-pay | Admitting: Urology

## 2021-02-20 ENCOUNTER — Other Ambulatory Visit: Payer: Self-pay

## 2021-02-20 ENCOUNTER — Ambulatory Visit: Payer: BC Managed Care – PPO | Admitting: Urology

## 2021-02-20 VITALS — BP 138/86 | HR 62 | Ht 73.0 in | Wt 239.0 lb

## 2021-02-20 DIAGNOSIS — Q5561 Curvature of penis (lateral): Secondary | ICD-10-CM

## 2021-02-20 DIAGNOSIS — C61 Malignant neoplasm of prostate: Secondary | ICD-10-CM

## 2021-02-20 DIAGNOSIS — N393 Stress incontinence (female) (male): Secondary | ICD-10-CM | POA: Diagnosis not present

## 2021-02-20 MED ORDER — ALPROSTADIL (VASODILATOR) 1000 MCG UR PLLT: PELLET | URETHRAL | 8 refills | Status: DC

## 2021-02-20 MED ORDER — TADALAFIL 20 MG PO TABS: ORAL_TABLET | ORAL | 1 refills | Status: DC

## 2021-03-06 IMAGING — CT CT MAXILLOFACIAL W/ CM
3 of 10 series · 13 of 47 positions shown, 15 images · IV contrast (omnipaque)
Comparison: No pertinent prior exams are available for comparison.

CLINICAL DATA: Aneurysmal bone cyst, other site. Additional history
provided: Patient reports bone cyst discovered when being evaluated
for tooth replacement, patient reports constant nasal drainage.

EXAM:
CT MAXILLOFACIAL WITH CONTRAST
TECHNIQUE: Multidetector CT imaging of the maxillofacial structures was
performed with intravenous contrast. Multiplanar CT image
reconstructions were also generated.
CONTRAST:  75mL OMNIPAQUE IOHEXOL 300 MG/ML  SOLN

[Series 6: with contrast sinus 2.00 cor · coronal · 0.25mm/px · 3 of 79 slices shown]
[im 20/79  bone]
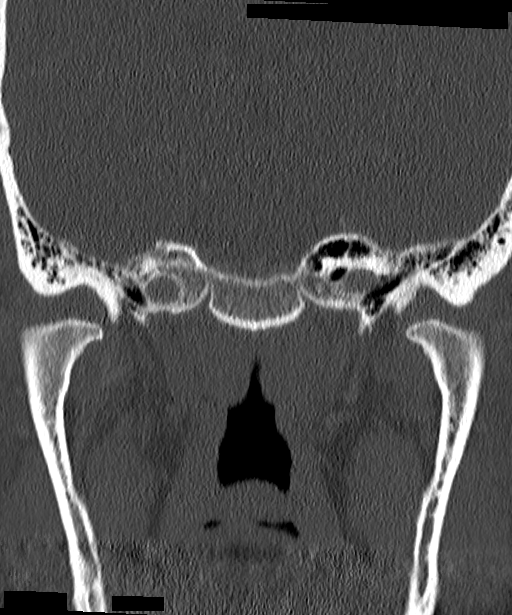
[im 40/79  bone]
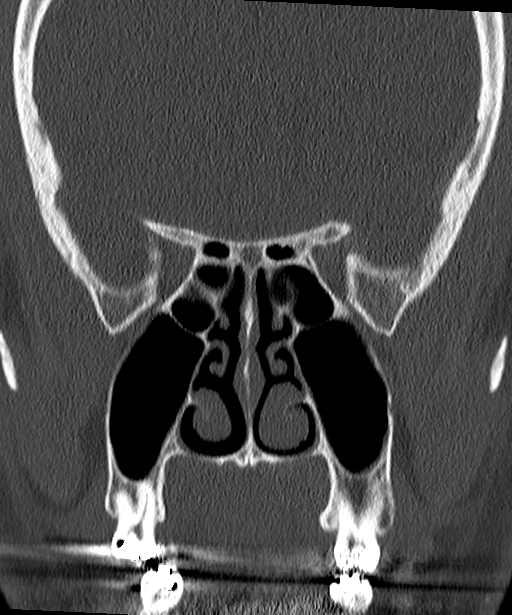
[im 59/79  bone]
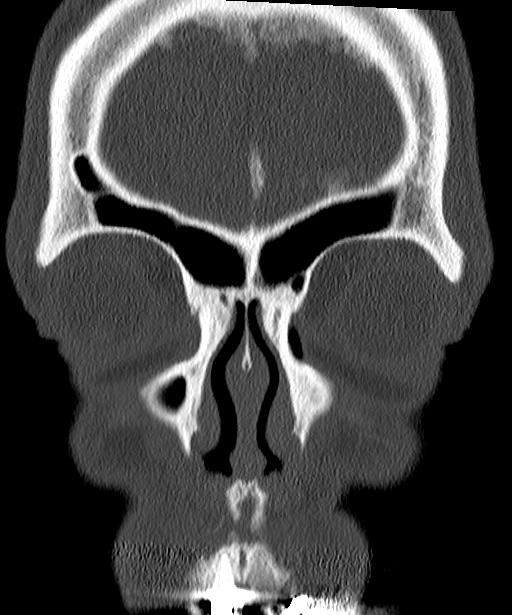

[Series 10: thins with contrast sinus 1.00 · axial · 0.30mm/px · z∈[-599,-476]mm · 8 of 155 slices shown, 10 images]
[im 16/155  brain]
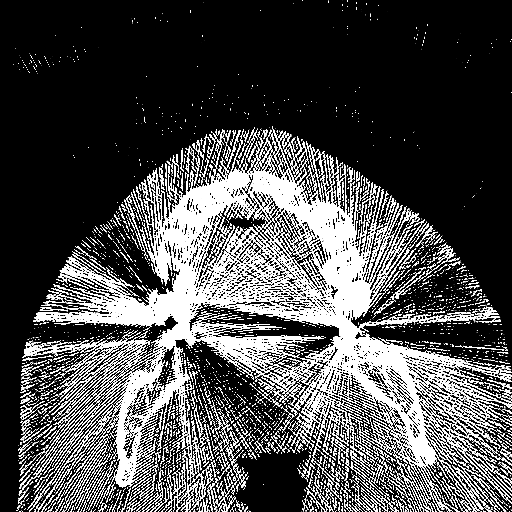
[im 16/155  bone]
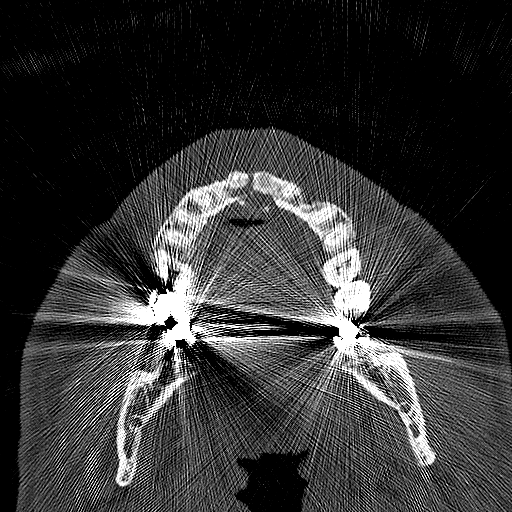
[im 31/155  bone]
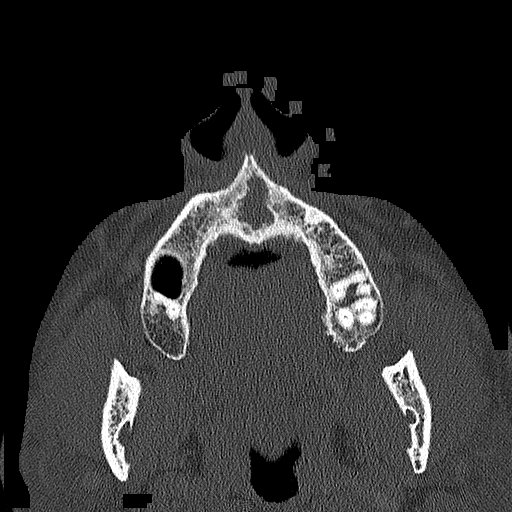
[im 47/155  bone]
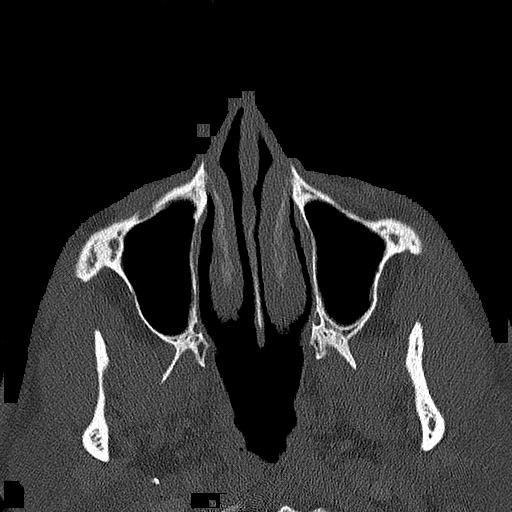
[im 62/155  bone]
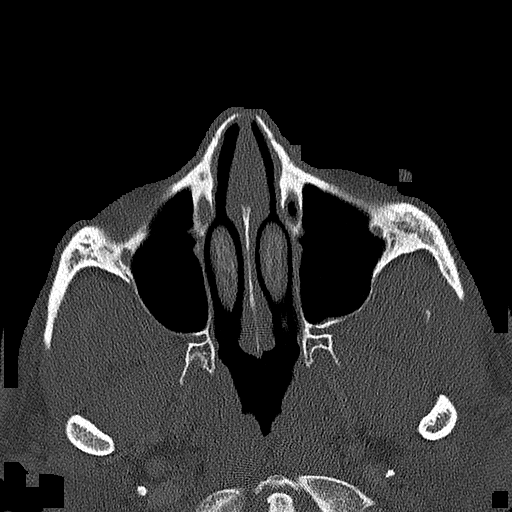
[im 93/155  brain]
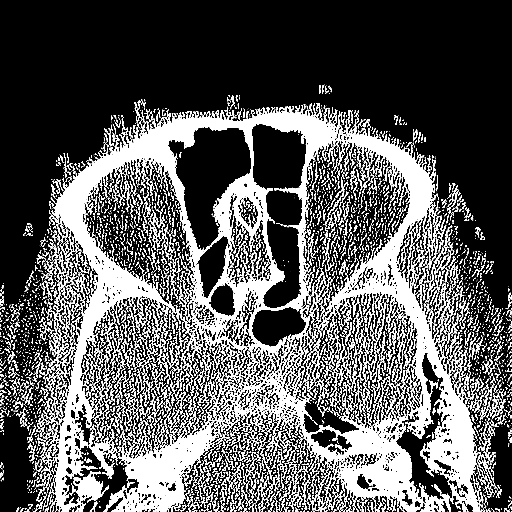
[im 93/155  bone]
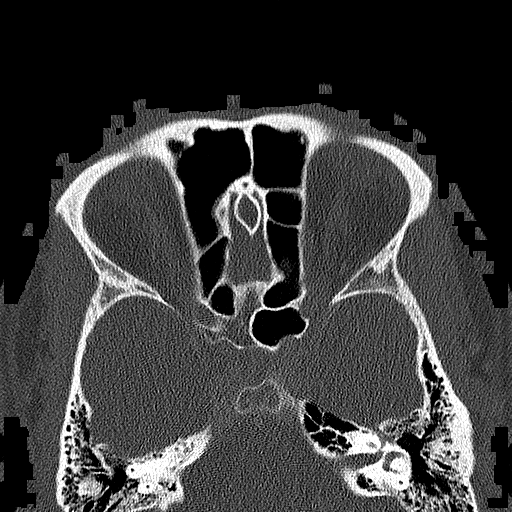
[im 108/155  bone]
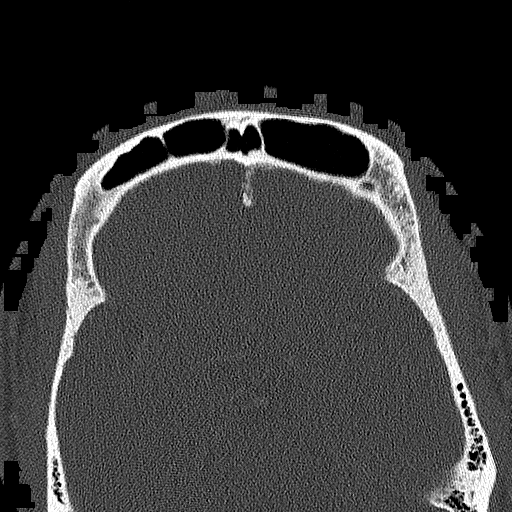
[im 124/155  bone]
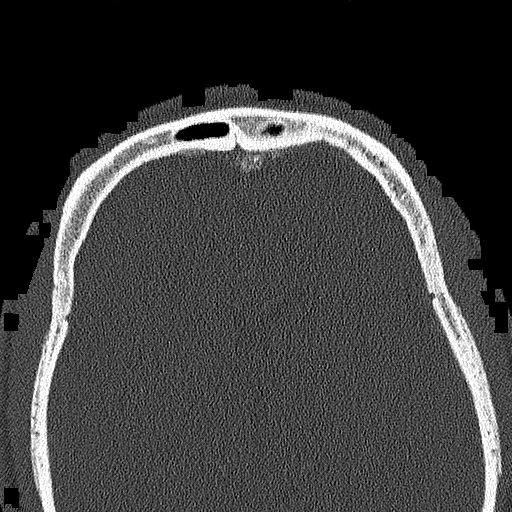
[im 139/155  bone]
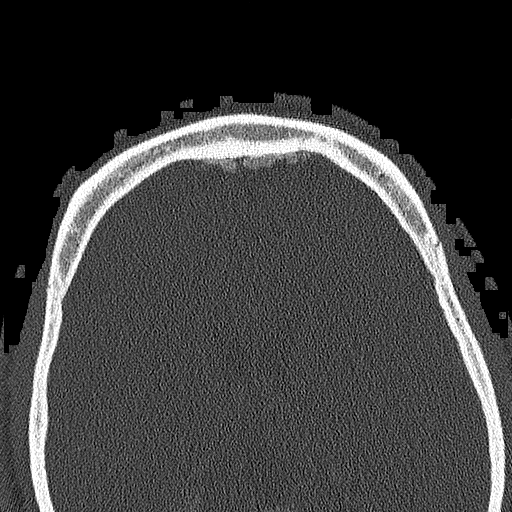

[Series 17: with contrast sinus 2.00 sag · sagittal · 0.12mm/px · 2 of 88 slices shown]
[im 30/88  bone]
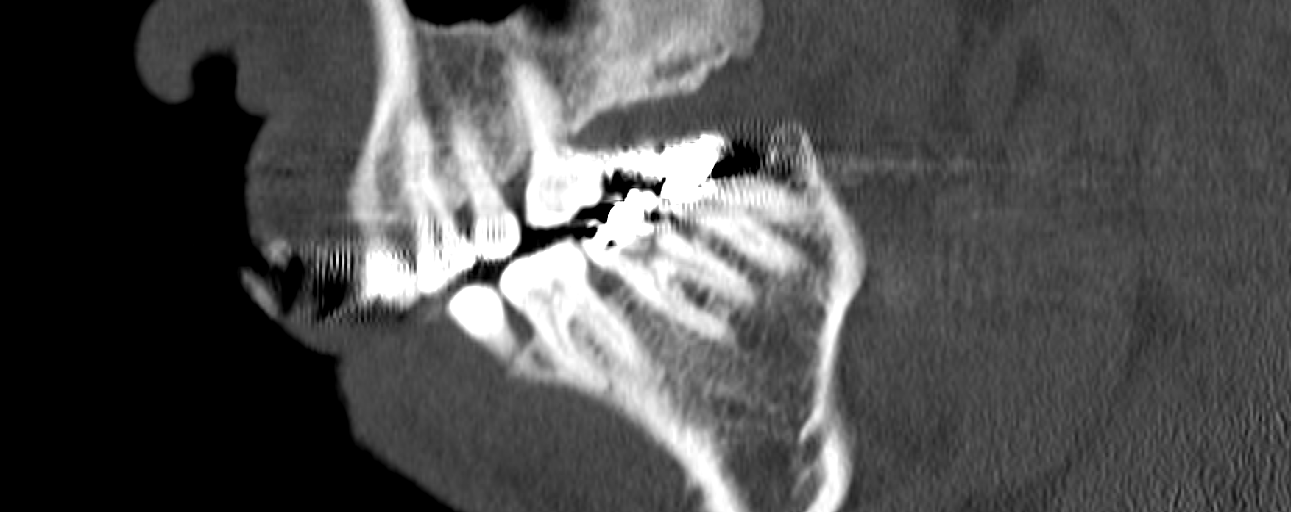
[im 59/88  bone]
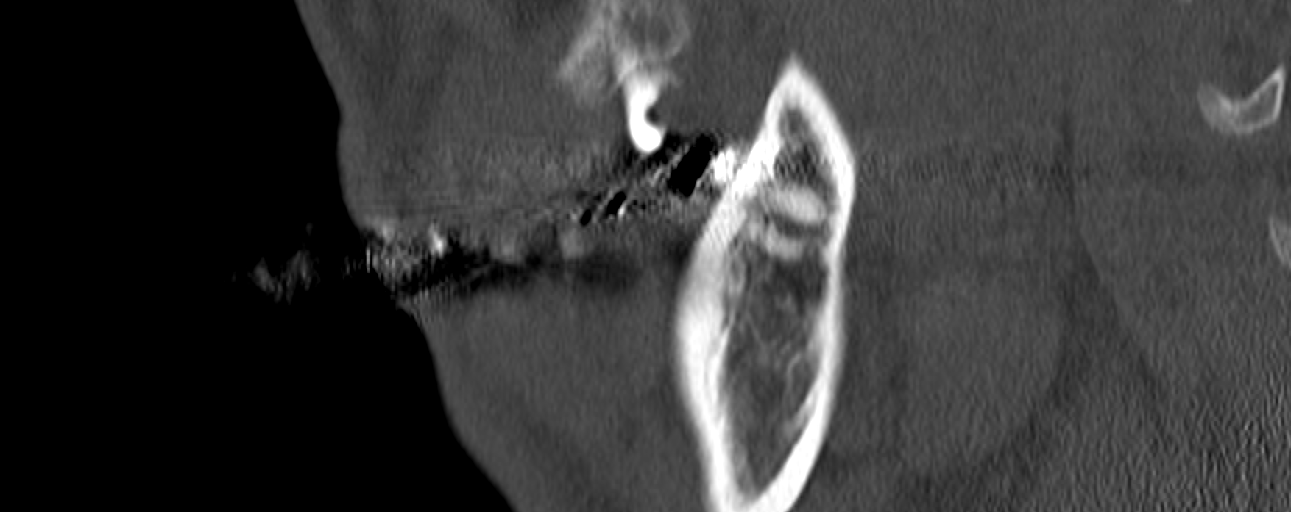

[13 of 47 positions shown; findings below may reference images not displayed]

FINDINGS: Osseous: There is a lucent lesion within the anterior midline
maxilla measuring 1.6 x 1.2 x 1.9 cm (AP x TV x CC) (for instance as
seen on series 10, image 128) (series 15, image 22). The margins of
this lesion are irregular in shape but smooth in contour. The lesion
extends to involve the floor of the nasal passages and appears to
communicate with the inferior nasal passages (for instance as seen
on series 6, images 26 and 27). Additionally, the outer cortical
margin is discontinuous (series 10, image 135). Immediately adjacent
to this lesion, the left maxillary incisor has been extracted and
there is a right maxillary incisor dental prosthesis.

Orbits: Chronic medially displaced fracture deformity of the right
lamina papyracea. Otherwise unremarkable.

Sinuses: Mild ethmoid sinus mucosal thickening. No significant
mastoid effusion at the imaged levels.

Soft tissues: Unremarkable.

Limited intracranial: No acute intracranial abnormality is
identified.
IMPRESSION: 1.6 x 1.2 x 1.9 cm lucent lesion within the anterior midline maxilla
as described. The margins of the lesion are irregular in shape but
smooth in contour. This finding is favored to reflect a
radicular/residual cyst. Sequela of prior osteomyelitis is difficult
to exclude and clinical correlation is recommended. Additionally,
correlation for any signs or symptoms of active infection is
recommended. Given the irregularity of the lesion margins, a
nasopalatine cyst is considered unlikely.

The above described lesion extends to involve the floor of the nasal
passages and communicates with the nasal passages inferiorly.
Additionally, there is cortical discontinuity at the anterior aspect
of the lesion. There is no appreciable surrounding soft tissue
swelling.

Chronic medially displaced fracture deformity of the right lamina
papyracea.

## 2021-03-10 ENCOUNTER — Telehealth: Payer: Self-pay

## 2021-03-10 DIAGNOSIS — N5231 Erectile dysfunction following radical prostatectomy: Secondary | ICD-10-CM

## 2021-03-10 MED ORDER — ALPROSTADIL (VASODILATOR) 1000 MCG UR PLLT: PELLET | URETHRAL | 8 refills | Status: DC

## 2021-03-10 NOTE — Telephone Encounter (Signed)
MUSE sent to [facility] per Larene Beach. Will notify pt via Mychart.

## 2021-10-30 ENCOUNTER — Other Ambulatory Visit: Payer: Self-pay | Admitting: Urology

## 2021-11-26 ENCOUNTER — Other Ambulatory Visit: Payer: Self-pay | Admitting: Urology

## 2021-11-26 DIAGNOSIS — N5231 Erectile dysfunction following radical prostatectomy: Secondary | ICD-10-CM

## 2021-11-26 MED ORDER — SILDENAFIL CITRATE 100 MG PO TABS: 100.0000 mg | ORAL_TABLET | Freq: Every day | ORAL | 3 refills | Status: DC | PRN

## 2022-02-16 ENCOUNTER — Other Ambulatory Visit: Payer: Self-pay

## 2022-02-16 DIAGNOSIS — C61 Malignant neoplasm of prostate: Secondary | ICD-10-CM

## 2022-02-17 ENCOUNTER — Other Ambulatory Visit: Payer: Self-pay

## 2022-02-17 DIAGNOSIS — C61 Malignant neoplasm of prostate: Secondary | ICD-10-CM

## 2022-02-18 LAB — PSA: Prostate Specific Ag, Serum: <0.1 ng/mL (ref 0.0–4.0)

## 2022-02-19 NOTE — Progress Notes (Addendum)
02/20/2022  9:30 AM   Garrett Watkins 1960/07/19 [id number]  Referring provider: Kirk Ruths, MD [facility] [place],  [place] [address]  Chief Complaint  Patient presents with   Follow-up    1 year follow-up   Urological history: 1. Prostate cancer -PSA (02/2022) <0.1 -s/p robotic prostatectomy 04/2016 for pT2N0Mx Gleason 4+3=7 adenocarcinoma with NEGATIVE margins. Pre-op PSA 4.1.  2. SUI - managed conservatively  3. ED -contributing factors of prostate cancer, pelvic surgery, HLD, HTN, sleep apnea and CAD -SHIM 18 -managed with vacuum erectaid device, MUSE, PDE5i's, ICI  and LiSWT  HPI: Garrett Watkins is a 62 y.o. male who presents today for yearly appointment.    He is having 1-7 daytime voids, 1-2 episodes of nocturia with a mild urge to urinate.  He is leaking 1-2 times a week.  Patient denies any modifying or aggravating factors.  Patient denies any gross hematuria, dysuria or suprapubic/flank pain.  Patient denies any fevers, chills, nausea or vomiting.    Patient still having spontaneous erections.  He denies any pain or curvature with erections.  He is having issues with maintaining erections.   SHIM     Row Name 02/20/22 0920         SHIM: Over the last 6 months:   How do you rate your confidence that you could get and keep an erection? High     When you had erections with sexual stimulation, how often were your erections hard enough for penetration (entering your partner)? Most Times (much more than half the time)     During sexual intercourse, how often were you able to maintain your erection after you had penetrated (entered) your partner? Sometimes (about half the time)     During sexual intercourse, how difficult was it to maintain your erection to completion of intercourse? Difficult     When you attempted sexual intercourse, how often was it satisfactory for you? Most Times (much more than half the time)       SHIM Total  Score   SHIM 18                Score: 1-7 Severe ED 8-11 Moderate ED 12-16 Mild-Moderate ED 17-21 Mild ED 22-25 No ED       PMH: Past Medical History:  Diagnosis Date   BPH (benign prostatic hyperplasia)    Colon adenoma    Dyslipidemia    GERD (gastroesophageal reflux disease)    history of   Hypertension    OSA (obstructive sleep apnea)    Prostate cancer [facility])     Surgical History: Past Surgical History:  Procedure Laterality Date   CARDIAC CATHETERIZATION Right 01/24/2016   Procedure: Left Heart Cath and Coronary Angiography;  Surgeon: Corey Skains, MD;  Location: [place] [facility];  Service: Cardiovascular;  Laterality: Right;   CYST EXCISION     LYMPHADENECTOMY Bilateral 04/21/2016   Procedure: PELVIC LYMPHADENECTOMY;  Surgeon: Alexis Frock, MD;  Location: [facility] ORS;  Service: Urology;  Laterality: Bilateral;   NO PAST SURGERIES     ROBOT ASSISTED LAPAROSCOPIC RADICAL PROSTATECTOMY N/A 04/21/2016   Procedure: ROBOTIC ASSISTED LAPAROSCOPIC RADICAL PROSTATECTOMY;  Surgeon: Alexis Frock, MD;  Location: [facility] ORS;  Service: Urology;  Laterality: N/A;   VASECTOMY      Home Medications:  Allergies as of 02/20/2022       Reactions   Amlodipine Other (See Comments)   Swelling in ankles (edema)---with higher doses per  patient. Other reaction(s): Other (See Comments) Swelling in ankles (edema)---with higher doses per patient. Edema (larger amounts)         Medication List        Accurate as of February 20, 2022  9:30 AM. If you have any questions, ask your nurse or doctor.          STOP taking these medications    fluticasone 50 MCG/ACT nasal spray Commonly known as: FLONASE Stopped by: Ledarrius Beauchaine, PA-C   ibuprofen 200 MG tablet Commonly known as: ADVIL Stopped by: Malaak Stach, PA-C   NONFORMULARY OR COMPOUNDED ITEM Stopped by: Aslyn Cottman, PA-C   TONALIN SAFFLOWER OIL CLA PO Stopped by: Mavin Dyke, PA-C        TAKE these medications    amLODipine 5 MG tablet Commonly known as: NORVASC Take 5 mg by mouth daily.   atorvastatin 80 MG tablet Commonly known as: LIPITOR TAKE 1 TABLET BY MOUTH EVERY DAY   eplerenone 50 MG tablet Commonly known as: INSPRA TAKE 1 TABLET BY MOUTH TWICE A DAY   hydrALAZINE 25 MG tablet Commonly known as: APRESOLINE TK 1 T PO TID   hydrochlorothiazide 25 MG tablet Commonly known as: HYDRODIURIL TAKE 1 TABLET BY MOUTH EVERY DAY   multivitamin with minerals Tabs tablet Take 1 tablet by mouth daily.   sildenafil 100 MG tablet Commonly known as: VIAGRA Take 1 tablet (100 mg total) by mouth daily as needed for erectile dysfunction. Take two hours prior to intercourse on an empty stomach   telmisartan 40 MG tablet Commonly known as: MICARDIS TAKE 1 TABLET BY MOUTH EVERY DAY        Allergies:  Allergies  Allergen Reactions   Amlodipine Other (See Comments)    Swelling in ankles (edema)---with higher doses per patient. Other reaction(s): Other (See Comments) Swelling in ankles (edema)---with higher doses per patient. Edema (larger amounts)     Family History: Family History  Problem Relation Age of Onset   CAD Mother    Prostate cancer Neg Hx    Adrenal disorder Neg Hx     Social History:  reports that he has never smoked. He has never been exposed to tobacco smoke. He has never used smokeless tobacco. He reports that he does not drink alcohol and does not use drugs.  For pertinent review of systems please refer to history of present illness  Physical Exam: BP (!) 149/87   Pulse 64   Ht 6' 1"$  (1.854 m)   Wt 231 lb (104.8 kg)   BMI 30.48 kg/m   Constitutional:  Well nourished. Alert and oriented, No acute distress. HEENT: [place] AT, moist mucus membranes.  Trachea midline Cardiovascular: No clubbing, cyanosis, or edema. Respiratory: Normal respiratory effort, no increased work of breathing. Neurologic: Grossly intact, no focal deficits,  moving all 4 extremities. Psychiatric: Normal mood and affect.   Laboratory Data: Component     Latest Ref Rng 02/17/2022  Prostate Specific Ag, Serum     0.0 - 4.0 ng/mL <0.1    Serum creatinine (09/2021) 1.0 Hemoglobin A1c (09/2021) 4.9 I have reviewed the labs.  Assessment & Plan:    1. Prostate cancer ([facility]) - PSA undetectable - excellent prognosis with node negative, margin negative disease and PSA remaining undetectable.   2. SUI (stress urinary incontinence), male  - doing well   3. Erectile dysfunction after radical prostatectomy - MUSE not readily available  - restart tadalafil 5 mg daily - will use Edex in the future  Patient will follow up in 1 year for PSA, SHIM and office visit  Zara Council, PA-C  [address] [place] [place], [place] [address] [phone]

## 2022-02-20 ENCOUNTER — Ambulatory Visit: Payer: No Typology Code available for payment source | Admitting: Urology

## 2022-02-20 ENCOUNTER — Encounter: Payer: Self-pay | Admitting: Urology

## 2022-02-20 VITALS — BP 149/87 | HR 64 | Ht 73.0 in | Wt 231.0 lb

## 2022-02-20 DIAGNOSIS — N5231 Erectile dysfunction following radical prostatectomy: Secondary | ICD-10-CM

## 2022-02-20 DIAGNOSIS — N393 Stress incontinence (female) (male): Secondary | ICD-10-CM

## 2022-02-20 DIAGNOSIS — C61 Malignant neoplasm of prostate: Secondary | ICD-10-CM | POA: Diagnosis not present

## 2022-02-20 MED ORDER — TADALAFIL 5 MG PO TABS: 5.0000 mg | ORAL_TABLET | Freq: Every day | ORAL | 3 refills | Status: DC | PRN

## 2022-03-20 ENCOUNTER — Other Ambulatory Visit: Payer: Self-pay | Admitting: Urology

## 2022-03-20 MED ORDER — ALPROSTADIL (VASODILATOR) 40 MCG IC KIT
40.0000 ug | PACK | INTRACAVERNOUS | 3 refills | Status: DC | PRN
Start: 2022-03-20 — End: 2023-02-23

## 2022-03-24 NOTE — Telephone Encounter (Signed)
Spoke to patient and informed him Edex was sent to [facility] on 03/20/2022 and he should be able to pick it up. Patient will make sure the medication is ready for pickup. If he has any trouble getting RX he will call our office back to change pharmacies.

## 2022-03-30 ENCOUNTER — Telehealth: Payer: Self-pay | Admitting: Family Medicine

## 2022-03-30 ENCOUNTER — Other Ambulatory Visit: Payer: Self-pay | Admitting: Urology

## 2022-03-30 MED ORDER — NONFORMULARY OR COMPOUNDED ITEM: 0 refills | Status: DC

## 2022-03-30 NOTE — Telephone Encounter (Signed)
Spoke to patient and the Edex is to expensive this year. He is will to try Timix again. Please advise

## 2022-12-17 ENCOUNTER — Other Ambulatory Visit: Payer: Self-pay | Admitting: Urology

## 2022-12-17 MED ORDER — NONFORMULARY OR COMPOUNDED ITEM: 0 refills | Status: AC

## 2023-01-25 ENCOUNTER — Other Ambulatory Visit: Payer: Self-pay | Admitting: *Deleted

## 2023-01-25 DIAGNOSIS — N5231 Erectile dysfunction following radical prostatectomy: Secondary | ICD-10-CM

## 2023-01-25 NOTE — Telephone Encounter (Signed)
 Ok to fill

## 2023-01-29 ENCOUNTER — Ambulatory Visit: Payer: No Typology Code available for payment source

## 2023-01-29 DIAGNOSIS — R1319 Other dysphagia: Secondary | ICD-10-CM | POA: Diagnosis not present

## 2023-01-29 DIAGNOSIS — Z09 Encounter for follow-up examination after completed treatment for conditions other than malignant neoplasm: Secondary | ICD-10-CM | POA: Diagnosis present

## 2023-01-29 DIAGNOSIS — K449 Diaphragmatic hernia without obstruction or gangrene: Secondary | ICD-10-CM | POA: Diagnosis not present

## 2023-01-29 DIAGNOSIS — K222 Esophageal obstruction: Secondary | ICD-10-CM | POA: Diagnosis not present

## 2023-01-29 DIAGNOSIS — D123 Benign neoplasm of transverse colon: Secondary | ICD-10-CM | POA: Diagnosis not present

## 2023-01-29 DIAGNOSIS — Z860101 Personal history of adenomatous and serrated colon polyps: Secondary | ICD-10-CM | POA: Diagnosis not present

## 2023-01-29 DIAGNOSIS — D122 Benign neoplasm of ascending colon: Secondary | ICD-10-CM | POA: Diagnosis not present

## 2023-01-29 DIAGNOSIS — K219 Gastro-esophageal reflux disease without esophagitis: Secondary | ICD-10-CM | POA: Diagnosis not present

## 2023-01-29 MED ORDER — TADALAFIL 5 MG PO TABS: 5.0000 mg | ORAL_TABLET | Freq: Every day | ORAL | 3 refills | Status: DC | PRN

## 2023-02-08 ENCOUNTER — Other Ambulatory Visit: Payer: Self-pay | Admitting: Urology

## 2023-02-08 DIAGNOSIS — N5231 Erectile dysfunction following radical prostatectomy: Secondary | ICD-10-CM

## 2023-02-08 MED ORDER — TADALAFIL 5 MG PO TABS: 5.0000 mg | ORAL_TABLET | Freq: Every day | ORAL | 3 refills | Status: DC | PRN

## 2023-02-18 NOTE — Progress Notes (Signed)
02/23/2023  8:13 AM   Garrett Watkins 1960/03/31 [id number]  Referring provider: Lauro Regulus, MD [address] [facility] [place],  [place] [address]  Urological history: 1. Prostate cancer -PSA pending  -s/p robotic prostatectomy 04/2016 for pT2N0Mx Gleason 4+3=7 adenocarcinoma with NEGATIVE margins. Pre-op PSA 4.1.  2. SUI - managed conservatively  3. ED -contributing factors of prostate cancer, pelvic surgery, HLD, HTN, sleep apnea and CAD -managed with vacuum erectaid device, MUSE, PDE5i's, ICI  and LiSWT  Chief Complaint  Patient presents with   Prostate Cancer    HPI: Garrett Watkins is a 63 y.o. male who presents today for yearly appointment.    Previous records reviewed.     He is having 1-7 daytime voids, 1-2 episodes of nocturia with a mild urge to urinate.  He has some remaining rare instances of stress urinary incontinence.  He does not wear absorbent products for urinary leakage.  He sometimes limits fluid intake.  He does not engage in toilet mapping.  Patient denies any modifying or aggravating factors.  Patient denies any recent UTI's, gross hematuria, dysuria or suprapubic/flank pain.  Patient denies any fevers, chills, nausea or vomiting.    SHIM 19   Over the last 3 months he has noted a decrease in his spontaneous erections.  He has been able to have satisfactory intercourse without using the Trimix, but his erections do not last as long as he would like.  He is not taking the tadalafil 5 mg daily.  He is not having pain with erections.  He has a very mild curve, but it does not interfere with intercourse.   SHIM     Row Name 02/23/23 0809         SHIM: Over the last 6 months:   How do you rate your confidence that you could get and keep an erection? Moderate     When you had erections with sexual stimulation, how often were your erections hard enough for penetration (entering your partner)? Most Times (much more than half the  time)     During sexual intercourse, how often were you able to maintain your erection after you had penetrated (entered) your partner? A Few Times (much less than half the time)     During sexual intercourse, how difficult was it to maintain your erection to completion of intercourse? Slightly Difficult     When you attempted sexual intercourse, how often was it satisfactory for you? Most Times (much more than half the time)       SHIM Total Score   SHIM 17                 Score: 1-7 Severe ED 8-11 Moderate ED 12-16 Mild-Moderate ED 17-21 Mild ED 22-25 No ED       PMH: Past Medical History:  Diagnosis Date   BPH (benign prostatic hyperplasia)    Colon adenoma    Dyslipidemia    GERD (gastroesophageal reflux disease)    history of   Hypertension    OSA (obstructive sleep apnea)    Prostate cancer [facility])     Surgical History: Past Surgical History:  Procedure Laterality Date   CARDIAC CATHETERIZATION Right 01/24/2016   Procedure: Left Heart Cath and Coronary Angiography;  Surgeon: Lamar Blinks, MD;  Location: [facility];  Service: Cardiovascular;  Laterality: Right;   CYST EXCISION     LYMPHADENECTOMY Bilateral 04/21/2016   Procedure: PELVIC LYMPHADENECTOMY;  Surgeon:  Sebastian Ache, MD;  Location: [facility];  Service: Urology;  Laterality: Bilateral;   NO PAST SURGERIES     ROBOT ASSISTED LAPAROSCOPIC RADICAL PROSTATECTOMY N/A 04/21/2016   Procedure: ROBOTIC ASSISTED LAPAROSCOPIC RADICAL PROSTATECTOMY;  Surgeon: Sebastian Ache, MD;  Location: [facility] OR[facility];  Service: Urology;  Laterality: N/A;   VASECTOMY      Home Medications:  Allergies as of 02/23/2023       Reactions   Amlodipine Other (See Comments)   Swelling in ankles (edema)---with higher doses per patient. Other reaction(s): Other (See Comments) Swelling in ankles (edema)---with higher doses per patient. Edema (larger amounts)         Medication List        Accurate as of February 23, 2023  8:13 AM. If you have any questions, ask your nurse or doctor.          alprostadil 40 MCG injection Commonly known as: EDEX 40 mcg by Intracavitary route as needed for erectile dysfunction. use no more than 3 times per week   amLODipine 5 MG tablet Commonly known as: NORVASC Take 5 mg by mouth daily.   atorvastatin 80 MG tablet Commonly known as: LIPITOR TAKE 1 TABLET BY MOUTH EVERY DAY   eplerenone 50 MG tablet Commonly known as: INSPRA TAKE 1 TABLET BY MOUTH TWICE A DAY   hydrALAZINE 25 MG tablet Commonly known as: APRESOLINE TK 1 T PO TID   hydrochlorothiazide 25 MG tablet Commonly known as: HYDRODIURIL TAKE 1 TABLET BY MOUTH EVERY DAY   multivitamin with minerals Tabs tablet Take 1 tablet by mouth daily.   NONFORMULARY OR COMPOUNDED ITEM Trimix (30/1/10)-(Pap/Phent/PGE)  Dosage: Inject 0.3 cc per injection   Vial 1ml  Qty #10 Refills 6  [facility] [phone] Fax [phone]   tadalafil 5 MG tablet Commonly known as: CIALIS Take 1 tablet (5 mg total) by mouth daily as needed for erectile dysfunction.   telmisartan 40 MG tablet Commonly known as: MICARDIS TAKE 1 TABLET BY MOUTH EVERY DAY        Allergies:  Allergies  Allergen Reactions   Amlodipine Other (See Comments)    Swelling in ankles (edema)---with higher doses per patient. Other reaction(s): Other (See Comments) Swelling in ankles (edema)---with higher doses per patient. Edema (larger amounts)     Family History: Family History  Problem Relation Age of Onset   CAD Mother    Prostate cancer Neg Hx    Adrenal disorder Neg Hx     Social History:  reports that he has never smoked. He has never been exposed to tobacco smoke. He has never used smokeless tobacco. He reports that he does not drink alcohol and does not use drugs.  For pertinent review of systems please refer to history of present illness  Physical Exam: BP (!) 149/78   Pulse 83   Ht 6\' 1"  (1.854  m)   Wt 223 lb (101.2 kg)   BMI 29.42 kg/m   Constitutional:  Well nourished. Alert and oriented, No acute distress. HEENT: [place] AT, moist mucus membranes.  Trachea midline, no masses. Cardiovascular: No clubbing, cyanosis, or edema. Respiratory: Normal respiratory effort, no increased work of breathing. Neurologic: Grossly intact, no focal deficits, moving all 4 extremities. Psychiatric: Normal mood and affect.   Laboratory Data: Comprehensive Metabolic Panel (CMP) Order: [id number] Component Ref Range & Units 4 mo ago  Glucose 70 - 110 mg/dL 85  Sodium 657 - 846 mmol/L 141  Potassium 3.6 - 5.1 mmol/L 3.9  Chloride 97 - 109 mmol/L 101  Carbon Dioxide (CO2) 22.0 - 32.0 mmol/L 33.1 High   Urea Nitrogen (BUN) 7 - 25 mg/dL 16  Creatinine 0.7 - 1.3 mg/dL 1.1  Glomerular Filtration Rate (eGFR) >60 mL/min/1.73sq m 76  Comment: CKD-EPI (2021) does not include patient's race in the calculation of eGFR.  Monitoring changes of plasma creatinine and eGFR over time is useful for monitoring kidney function.  Interpretive Ranges for eGFR (CKD-EPI 2021):  eGFR:       >60 mL/min/1.73 sq. m - Normal eGFR:       30-59 mL/min/1.73 sq. m - Moderately Decreased eGFR:       15-29 mL/min/1.73 sq. m  - Severely Decreased eGFR:       < 15 mL/min/1.73 sq. m  - Kidney Failure   Note: These eGFR calculations do not apply in acute situations when eGFR is changing rapidly or patients on dialysis.  Calcium 8.7 - 10.3 mg/dL 9.8  AST 8 - 39 U/L 27  ALT 6 - 57 U/L 17  Alk Phos (alkaline Phosphatase) 34 - 104 U/L 85  Albumin 3.5 - 4.8 g/dL 4.4  Bilirubin, Total 0.3 - 1.2 mg/dL 1.1  Protein, Total 6.1 - 7.9 g/dL 7  A/G Ratio 1.0 - 5.0 gm/dL 1.7  Resulting Agency [facility]   Specimen Collected: 10/13/22 10:36   Performed by: [facility] Last Resulted: 10/13/22 15:10  Received From: [facility]  Result Received: 12/17/22 14:38   Hemoglobin  A1C Order: [id number] Component Ref Range & Units 4 mo ago  Hemoglobin A1C 4.2 - 5.6 % 4.6  Average Blood Glucose (Calc) mg/dL 85  Resulting Agency [facility]  Narrative Performed by [facility] Normal Range:    4.2 - 5.6% Increased Risk:  5.7 - 6.4% Diabetes:        >= 6.5% Glycemic Control for adults with diabetes:  <7%    Specimen Collected: 10/13/22 10:36   Performed by: [facility] Last Resulted: 10/13/22 11:38  Received From: [facility]  Result Received: 12/17/22 14:38   Lipid Panel w/calc LDL Order: [id number] Component Ref Range & Units 4 mo ago  Cholesterol, Total 100 - 200 mg/dL 782  Triglyceride 35 - 199 mg/dL 74  HDL (High Density Lipoprotein) Cholesterol 29.0 - 71.0 mg/dL 95.6  LDL Calculated 0 - 130 mg/dL 213  VLDL Cholesterol mg/dL 15  Cholesterol/HDL Ratio 3.3  Resulting Agency [facility]   Specimen Collected: 10/13/22 10:36   Performed by: [facility] Last Resulted: 10/13/22 15:10  Received From: [facility]  Result Received: 12/17/22 14:38  I have reviewed the labs.  Assessment & Plan:    1. Prostate cancer ([facility]) - PSA pending    2. SUI (stress urinary incontinence), male  - doing well   3. Erectile dysfunction after radical prostatectomy - continue tadalafil 5 mg daily - Trimix prn  Patient will follow up in 1 year for PSA, SHIM and office visit  Cloretta Ned  [facility] [address] [place], [place] [address] [phone]

## 2023-02-22 ENCOUNTER — Other Ambulatory Visit: Payer: No Typology Code available for payment source

## 2023-02-22 DIAGNOSIS — C61 Malignant neoplasm of prostate: Secondary | ICD-10-CM

## 2023-02-23 ENCOUNTER — Encounter: Payer: Self-pay | Admitting: Urology

## 2023-02-23 ENCOUNTER — Ambulatory Visit: Payer: No Typology Code available for payment source | Admitting: Urology

## 2023-02-23 VITALS — BP 149/78 | HR 83 | Ht 73.0 in | Wt 223.0 lb

## 2023-02-23 DIAGNOSIS — N393 Stress incontinence (female) (male): Secondary | ICD-10-CM

## 2023-02-23 DIAGNOSIS — N5231 Erectile dysfunction following radical prostatectomy: Secondary | ICD-10-CM

## 2023-02-23 DIAGNOSIS — C61 Malignant neoplasm of prostate: Secondary | ICD-10-CM

## 2023-02-23 LAB — PSA: Prostate Specific Ag, Serum: <0.1 ng/mL (ref 0.0–4.0)

## 2023-02-23 MED ORDER — TADALAFIL 5 MG PO TABS: 5.0000 mg | ORAL_TABLET | Freq: Every day | ORAL | 3 refills | Status: AC | PRN

## 2023-02-23 NOTE — Addendum Note (Signed)
Addended by: Cannon Kettle on: 02/23/2023 09:08 AM   Modules accepted: Orders

## 2024-02-08 ENCOUNTER — Other Ambulatory Visit: Payer: Self-pay | Admitting: Urology

## 2024-02-08 DIAGNOSIS — N5231 Erectile dysfunction following radical prostatectomy: Secondary | ICD-10-CM

## 2024-02-08 MED ORDER — AMBULATORY NON FORMULARY MEDICATION: 0 refills | Status: AC

## 2024-02-15 ENCOUNTER — Other Ambulatory Visit: Payer: No Typology Code available for payment source

## 2024-02-17 ENCOUNTER — Other Ambulatory Visit

## 2024-02-17 DIAGNOSIS — C61 Malignant neoplasm of prostate: Secondary | ICD-10-CM

## 2024-02-18 LAB — PSA: Prostate Specific Ag, Serum: <0.1 ng/mL (ref 0.0–4.0)

## 2024-02-22 ENCOUNTER — Ambulatory Visit: Admitting: Urology

## 2024-02-22 ENCOUNTER — Ambulatory Visit: Payer: No Typology Code available for payment source | Admitting: Urology
